# Patient Record
Sex: Female | Born: 1935 | Race: White | Hispanic: No | Marital: Married | Smoking: Former smoker
Health system: Southern US, Community
[De-identification: ages and names within clinical notes are randomized; demographics above are authoritative.]

## PROBLEM LIST (undated history)

## (undated) DIAGNOSIS — J45909 Unspecified asthma, uncomplicated: Secondary | ICD-10-CM

## (undated) DIAGNOSIS — I4891 Unspecified atrial fibrillation: Secondary | ICD-10-CM

## (undated) DIAGNOSIS — C801 Malignant (primary) neoplasm, unspecified: Secondary | ICD-10-CM

## (undated) DIAGNOSIS — I499 Cardiac arrhythmia, unspecified: Secondary | ICD-10-CM

## (undated) DIAGNOSIS — I2510 Atherosclerotic heart disease of native coronary artery without angina pectoris: Secondary | ICD-10-CM

## (undated) DIAGNOSIS — J449 Chronic obstructive pulmonary disease, unspecified: Secondary | ICD-10-CM

## (undated) DIAGNOSIS — F32A Depression, unspecified: Secondary | ICD-10-CM

## (undated) DIAGNOSIS — F329 Major depressive disorder, single episode, unspecified: Secondary | ICD-10-CM

## (undated) DIAGNOSIS — M1990 Unspecified osteoarthritis, unspecified site: Secondary | ICD-10-CM

## (undated) DIAGNOSIS — G4730 Sleep apnea, unspecified: Secondary | ICD-10-CM

## (undated) HISTORY — PX: FRACTURE SURGERY: SHX138

## (undated) HISTORY — PX: CARDIAC CATHETERIZATION: SHX172

## (undated) HISTORY — PX: APPENDECTOMY: SHX54

---

## 2003-07-29 ENCOUNTER — Other Ambulatory Visit: Payer: Self-pay

## 2004-04-07 ENCOUNTER — Ambulatory Visit: Payer: Self-pay | Admitting: Unknown Physician Specialty

## 2004-04-19 ENCOUNTER — Ambulatory Visit: Payer: Self-pay | Admitting: Unknown Physician Specialty

## 2004-05-25 ENCOUNTER — Ambulatory Visit: Payer: Self-pay | Admitting: Gastroenterology

## 2005-01-27 ENCOUNTER — Ambulatory Visit: Payer: Self-pay | Admitting: Ophthalmology

## 2005-02-06 ENCOUNTER — Ambulatory Visit: Payer: Self-pay | Admitting: Ophthalmology

## 2006-09-28 ENCOUNTER — Ambulatory Visit: Payer: Self-pay | Admitting: Unknown Physician Specialty

## 2006-12-27 ENCOUNTER — Ambulatory Visit: Payer: Self-pay | Admitting: Physician Assistant

## 2007-03-25 ENCOUNTER — Ambulatory Visit: Payer: Self-pay | Admitting: Ophthalmology

## 2007-03-25 HISTORY — PX: CATARACT EXTRACTION W/ INTRAOCULAR LENS IMPLANT: SHX1309

## 2007-06-03 ENCOUNTER — Ambulatory Visit: Payer: Self-pay | Admitting: Internal Medicine

## 2007-06-28 ENCOUNTER — Ambulatory Visit: Payer: Self-pay | Admitting: Oncology

## 2007-07-04 ENCOUNTER — Ambulatory Visit: Payer: Self-pay | Admitting: Oncology

## 2007-07-24 ENCOUNTER — Ambulatory Visit: Payer: Self-pay | Admitting: Unknown Physician Specialty

## 2007-07-29 ENCOUNTER — Ambulatory Visit: Payer: Self-pay | Admitting: Oncology

## 2008-02-28 HISTORY — PX: CATARACT EXTRACTION W/ INTRAOCULAR LENS IMPLANT: SHX1309

## 2008-05-28 ENCOUNTER — Ambulatory Visit: Payer: Self-pay | Admitting: Oncology

## 2008-06-03 ENCOUNTER — Ambulatory Visit: Payer: Self-pay | Admitting: Oncology

## 2008-06-18 ENCOUNTER — Ambulatory Visit: Payer: Self-pay | Admitting: Oncology

## 2008-06-27 ENCOUNTER — Ambulatory Visit: Payer: Self-pay | Admitting: Oncology

## 2008-08-26 ENCOUNTER — Ambulatory Visit: Payer: Self-pay | Admitting: Internal Medicine

## 2008-09-24 ENCOUNTER — Ambulatory Visit: Payer: Self-pay | Admitting: Unknown Physician Specialty

## 2009-01-27 ENCOUNTER — Inpatient Hospital Stay: Payer: Self-pay | Admitting: Internal Medicine

## 2009-03-11 ENCOUNTER — Ambulatory Visit: Payer: Self-pay | Admitting: Unknown Physician Specialty

## 2009-03-24 ENCOUNTER — Ambulatory Visit: Payer: Self-pay | Admitting: Unknown Physician Specialty

## 2009-04-01 ENCOUNTER — Emergency Department: Payer: Self-pay | Admitting: Emergency Medicine

## 2009-06-07 ENCOUNTER — Ambulatory Visit: Payer: Self-pay | Admitting: Oncology

## 2009-06-27 ENCOUNTER — Ambulatory Visit: Payer: Self-pay | Admitting: Oncology

## 2009-06-30 ENCOUNTER — Ambulatory Visit: Payer: Self-pay | Admitting: Oncology

## 2009-07-28 ENCOUNTER — Ambulatory Visit: Payer: Self-pay | Admitting: Oncology

## 2010-04-26 ENCOUNTER — Ambulatory Visit: Payer: Self-pay | Admitting: Cardiology

## 2010-06-09 ENCOUNTER — Ambulatory Visit: Payer: Self-pay | Admitting: Oncology

## 2010-06-15 ENCOUNTER — Ambulatory Visit: Payer: Self-pay | Admitting: Oncology

## 2010-06-28 ENCOUNTER — Ambulatory Visit: Payer: Self-pay | Admitting: Oncology

## 2010-10-18 ENCOUNTER — Emergency Department: Payer: Self-pay | Admitting: Emergency Medicine

## 2011-06-12 ENCOUNTER — Ambulatory Visit: Payer: Self-pay | Admitting: Oncology

## 2011-06-16 ENCOUNTER — Ambulatory Visit: Payer: Self-pay | Admitting: Oncology

## 2011-06-28 ENCOUNTER — Ambulatory Visit: Payer: Self-pay | Admitting: Oncology

## 2012-01-09 ENCOUNTER — Observation Stay: Payer: Self-pay | Admitting: Internal Medicine

## 2012-01-09 LAB — COMPREHENSIVE METABOLIC PANEL
Alkaline Phosphatase: 75 U/L (ref 50–136)
Calcium, Total: 8.4 mg/dL — ABNORMAL LOW (ref 8.5–10.1)
Co2: 27 mmol/L (ref 21–32)
EGFR (Non-African Amer.): 46 — ABNORMAL LOW
Glucose: 107 mg/dL — ABNORMAL HIGH (ref 65–99)
Osmolality: 283 (ref 275–301)
SGOT(AST): 30 U/L (ref 15–37)
SGPT (ALT): 18 U/L (ref 12–78)
Sodium: 140 mmol/L (ref 136–145)

## 2012-01-09 LAB — CK TOTAL AND CKMB (NOT AT ARMC)
CK, Total: 78 U/L (ref 21–215)
CK, Total: 77 U/L (ref 21–215)
CK-MB: 1.9 ng/mL (ref 0.5–3.6)
CK-MB: 1.9 ng/mL (ref 0.5–3.6)

## 2012-01-09 LAB — CBC
HGB: 11.9 g/dL — ABNORMAL LOW (ref 12.0–16.0)
MCH: 28.6 pg (ref 26.0–34.0)
MCHC: 33 g/dL (ref 32.0–36.0)
MCV: 87 fL (ref 80–100)
RBC: 4.15 10*6/uL (ref 3.80–5.20)
WBC: 6.4 10*3/uL (ref 3.6–11.0)

## 2012-01-09 LAB — PROTIME-INR: INR: 2.5

## 2012-01-09 LAB — APTT: Activated PTT: 36 secs — ABNORMAL HIGH (ref 23.6–35.9)

## 2012-01-09 LAB — TROPONIN I: Troponin-I: <0.02 ng/mL

## 2012-01-10 LAB — CK TOTAL AND CKMB (NOT AT ARMC)
CK, Total: 70 U/L (ref 21–215)
CK-MB: 1.5 ng/mL (ref 0.5–3.6)

## 2012-01-10 LAB — LIPID PANEL
Cholesterol: 152 mg/dL (ref 0–200)
HDL Cholesterol: 48 mg/dL (ref 40–60)
Ldl Cholesterol, Calc: 68 mg/dL (ref 0–100)
VLDL Cholesterol, Calc: 36 mg/dL (ref 5–40)

## 2012-01-10 LAB — TROPONIN I: Troponin-I: <0.02 ng/mL

## 2012-01-11 LAB — PROTIME-INR
INR: 2.5
Prothrombin Time: 26.9 secs — ABNORMAL HIGH (ref 11.5–14.7)

## 2012-06-12 ENCOUNTER — Ambulatory Visit: Payer: Self-pay | Admitting: Internal Medicine

## 2012-12-03 ENCOUNTER — Ambulatory Visit: Payer: Self-pay | Admitting: Internal Medicine

## 2013-05-18 ENCOUNTER — Inpatient Hospital Stay: Payer: Self-pay | Admitting: Internal Medicine

## 2013-05-18 LAB — PROTIME-INR
INR: 3
Prothrombin Time: 29.9 secs — ABNORMAL HIGH (ref 11.5–14.7)

## 2013-05-18 LAB — CBC
HCT: 34.9 % — AB (ref 35.0–47.0)
HGB: 11.2 g/dL — ABNORMAL LOW (ref 12.0–16.0)
MCH: 27.6 pg (ref 26.0–34.0)
MCHC: 32.2 g/dL (ref 32.0–36.0)
MCV: 86 fL (ref 80–100)
PLATELETS: 189 10*3/uL (ref 150–440)
RBC: 4.08 10*6/uL (ref 3.80–5.20)
RDW: 15.4 % — ABNORMAL HIGH (ref 11.5–14.5)
WBC: 7.4 10*3/uL (ref 3.6–11.0)

## 2013-05-18 LAB — BASIC METABOLIC PANEL
Anion Gap: 5 — ABNORMAL LOW (ref 7–16)
BUN: 16 mg/dL (ref 7–18)
CHLORIDE: 110 mmol/L — AB (ref 98–107)
Calcium, Total: 8.4 mg/dL — ABNORMAL LOW (ref 8.5–10.1)
Co2: 25 mmol/L (ref 21–32)
Creatinine: 1.19 mg/dL (ref 0.60–1.30)
EGFR (African American): 49 — ABNORMAL LOW
EGFR (Non-African Amer.): 42 — ABNORMAL LOW
Glucose: 136 mg/dL — ABNORMAL HIGH (ref 65–99)
OSMOLALITY: 283 (ref 275–301)
Potassium: 3.7 mmol/L (ref 3.5–5.1)
Sodium: 140 mmol/L (ref 136–145)

## 2013-05-18 LAB — URINALYSIS, COMPLETE
BILIRUBIN, UR: NEGATIVE
BLOOD: NEGATIVE
Bacteria: NONE SEEN
Glucose,UR: NEGATIVE mg/dL (ref 0–75)
Ketone: NEGATIVE
LEUKOCYTE ESTERASE: NEGATIVE
NITRITE: NEGATIVE
PROTEIN: NEGATIVE
Ph: 7 (ref 4.5–8.0)
RBC,UR: 1 /HPF (ref 0–5)
Specific Gravity: 1.008 (ref 1.003–1.030)
Squamous Epithelial: 1

## 2013-05-18 LAB — TROPONIN I
TROPONIN-I: 0.03 ng/mL
TROPONIN-I: 0.23 ng/mL — AB
Troponin-I: 0.39 ng/mL — ABNORMAL HIGH

## 2013-05-18 LAB — CK-MB
CK-MB: 2.8 ng/mL (ref 0.5–3.6)
CK-MB: 4 ng/mL — ABNORMAL HIGH (ref 0.5–3.6)
CK-MB: 4.1 ng/mL — AB (ref 0.5–3.6)
CK-MB: 3.9 ng/mL — ABNORMAL HIGH (ref 0.5–3.6)

## 2013-05-18 LAB — PRO B NATRIURETIC PEPTIDE: B-Type Natriuretic Peptide: 651 pg/mL — ABNORMAL HIGH (ref 0–450)

## 2013-05-19 LAB — CBC WITH DIFFERENTIAL/PLATELET
BASOS ABS: 0.1 10*3/uL (ref 0.0–0.1)
BASOS PCT: 1.3 %
Eosinophil #: 0.3 10*3/uL (ref 0.0–0.7)
Eosinophil %: 4 %
HCT: 33 % — AB (ref 35.0–47.0)
HGB: 10.6 g/dL — AB (ref 12.0–16.0)
LYMPHS ABS: 3.4 10*3/uL (ref 1.0–3.6)
Lymphocyte %: 43.9 %
MCH: 27.6 pg (ref 26.0–34.0)
MCHC: 32.1 g/dL (ref 32.0–36.0)
MCV: 86 fL (ref 80–100)
MONO ABS: 0.7 x10 3/mm (ref 0.2–0.9)
Monocyte %: 9.2 %
NEUTROS ABS: 3.2 10*3/uL (ref 1.4–6.5)
NEUTROS PCT: 41.6 %
PLATELETS: 197 10*3/uL (ref 150–440)
RBC: 3.85 10*6/uL (ref 3.80–5.20)
RDW: 14.9 % — ABNORMAL HIGH (ref 11.5–14.5)
WBC: 7.8 10*3/uL (ref 3.6–11.0)

## 2013-05-19 LAB — BASIC METABOLIC PANEL
ANION GAP: 3 — AB (ref 7–16)
BUN: 18 mg/dL (ref 7–18)
CREATININE: 1.09 mg/dL (ref 0.60–1.30)
Calcium, Total: 8.5 mg/dL (ref 8.5–10.1)
Chloride: 110 mmol/L — ABNORMAL HIGH (ref 98–107)
Co2: 27 mmol/L (ref 21–32)
GFR CALC AF AMER: 55 — AB
GFR CALC NON AF AMER: 47 — AB
GLUCOSE: 95 mg/dL (ref 65–99)
Osmolality: 281 (ref 275–301)
POTASSIUM: 4.3 mmol/L (ref 3.5–5.1)
Sodium: 140 mmol/L (ref 136–145)

## 2013-05-19 LAB — PROTIME-INR
INR: 2.9
Prothrombin Time: 29.7 secs — ABNORMAL HIGH (ref 11.5–14.7)

## 2013-05-19 LAB — LIPID PANEL
Cholesterol: 136 mg/dL (ref 0–200)
HDL Cholesterol: 46 mg/dL (ref 40–60)
Ldl Cholesterol, Calc: 58 mg/dL (ref 0–100)
TRIGLYCERIDES: 162 mg/dL (ref 0–200)
VLDL Cholesterol, Calc: 32 mg/dL (ref 5–40)

## 2013-05-19 LAB — MAGNESIUM: Magnesium: 1.7 mg/dL — ABNORMAL LOW

## 2013-05-19 LAB — CK-MB: CK-MB: 3.5 ng/mL (ref 0.5–3.6)

## 2013-05-19 LAB — TSH: Thyroid Stimulating Horm: 3.22 u[IU]/mL

## 2013-06-13 ENCOUNTER — Ambulatory Visit: Payer: Self-pay | Admitting: Internal Medicine

## 2014-05-31 ENCOUNTER — Emergency Department
Admit: 2014-05-31 | Disposition: A | Discharge status: Home / Self Care | Payer: Self-pay | Admitting: Physician Assistant

## 2014-06-15 ENCOUNTER — Ambulatory Visit
Admit: 2014-06-15 | Disposition: A | Discharge status: Home / Self Care | Payer: Self-pay | Attending: Internal Medicine | Admitting: Internal Medicine

## 2014-06-16 NOTE — H&P (Signed)
PATIENT NAME:  Madeline Cunningham, Madeline Cunningham MR#:  [id number] DATE OF BIRTH:  1930-02-14  DATE OF ADMISSION:  01/09/2012  PRIMARY CARE PHYSICIAN: Dr. Frazier Richards ER REFERRING PHYSICIAN:  Dr. Corky Downs   CHIEF COMPLAINT: Chest pain.   HISTORY OF PRESENT ILLNESS: The patient is an 81 year old female with history of hypertension, chronic history of hypertension, deep vein thrombosis and PE, and also history of chronic obstructive pulmonary disease, came in with chest pain that started yesterday afternoon but got worse since this morning, complains of midsternal chest discomfort radiating to the back, relieved with nitroglycerin. Also, the patient also noticed some cough and also pedal edema. She denies any nausea, vomiting, or sweating, and chest pain was relieved with nitroglycerin. There are no aggravating factors. She says that she had around 6 out of 10 in severity of the chest pain.  She did not lose any consciousness, no palpitations. The patient's blood pressure in the Emergency Room was 202/77 and pulse 68. The patient received nitroglycerin sublingual in the Emergency Room. Right now she is chest pain-free.   PAST MEDICAL HISTORY: Significant for: 1. History of pulmonary embolus in 1999 and required thrombolytics, and she has been on Coumadin since then.  2. Hypertension.  3. Gastroesophageal reflux disease.  4. Depression.  5. History of angina, had a cardiac catheterization in 2004 and also 2012.  6. History of depression and anxiety.  7. History of breast cancer, status post radiation.  8. Osteoarthritis.  9. Osteoporosis.  10. The patient had a cardiac catheterization also in February last year which showed one vessel coronary artery disease with chronically occluded RCA and moderate collaterals from LAD. The patient had stenosis of the left main and 100% stenosis of the RCA.   MEDICATIONS:  1. Advair as needed.  2. Albuterol as needed.  3. Aspirin 81 mg daily.  4. Calcium 2400 mg once a  week. 5. Coumadin 5 mg daily.  6. Neurontin 300 mg p.o. t.i.d.  7. Ativan 0.5 mg as needed. 8. Methadone 5 mg p.o. b.i.d. as needed. 9. Metoprolol succinate 200 mg extended release daily.  10. Requip 5 mg p.o. daily.  11. Vitamin D 50,000 units once a week.   SOCIAL HISTORY: The patient quit smoking a long time back. No alcohol. No drugs. Lives with her husband.   PAST SURGICAL HISTORY: Significant for history of breast lumpectomy.   ALLERGIES: Evista caused the PE, Phenergan caused anaphylaxis, Prempro caused breast soreness. Zoloft causes gastrointestinal distress.   FAMILY HISTORY: Positive for coronary artery disease and hypertension.   REVIEW OF SYSTEMS:  CONSTITUTIONAL: Denies fever or fatigue. EYES: No blurred vision. ENT: No tinnitus. No ear pain. No difficulty swallowing. No epistaxis. RESPIRATORY:  The patient has cough. No wheezing. The patient has a history of chronic obstructive pulmonary disease and also uses oxygen at night. CARDIOVASCULAR: Chest pain since earlier this morning. No palpitations. No syncope. GASTROINTESTINAL: No nausea. No vomiting. No abdominal pain. GENITOURINARY: No dysuria. ENDOCRINE: No polyuria, no polydipsia. INTEGUMENTARY: No skin rashes. MUSCULOSKELETAL: No joint pain. NEUROLOGIC: No numbness or weakness. PSYCH: No anxiety or insomnia.   PHYSICAL EXAMINATION:  VITAL SIGNS: Temperature 98 degrees Fahrenheit, pulse 68, respirations 20, blood pressure 202/77, saturations 96% on room air. During my visit the blood pressure was in 173/69, pulse 62.   GENERAL: She is alert, awake, oriented, answering questions appropriately.   HEENT: Head: Atraumatic, normocephalic. Pupils are equally reacting to light. Extraocular movements intact. ENT: No tympanic membrane congestion. No turbinate hypertrophy. No  oropharyngeal erythema.  NECK: Normal range of motion. No JVD. No carotid bruit. No lymphadenopathy.   CARDIOVASCULAR: S1, S2 regular. No murmurs. PMI is not  displaced. Pulses are intact bilaterally for pedal pulses and dorsalis pedis.   LUNGS: Clear to auscultation. No wheeze. No rales. Not using accessory muscles for respiration.  ABDOMEN: Soft, nontender, nondistended. Bowel sounds are present. No hernias. No CVA tenderness.   EXTREMITIES: The patient has trace edema. No cyanosis. No clubbing.   NEUROLOGIC: Alert, awake, oriented. Cranial nerves II through XII are intact. Power five out of five in upper and lower extremities. Deep tendon reflexes 2+ bilaterally. No motor and sensory deficit.   PSYCHIATRIC: Mood and affect are within normal limits.   LABORATORY, DIAGNOSTIC AND RADIOLOGICAL DATA: Troponin less than 0.02, CK total 78, CPK-MB 1.9, INR 2.5. WBC 6.4, hemoglobin 11.9, hematocrit 36, platelets 203. Electrolytes: Sodium 140, potassium 4.4, chloride 109, bicarbonate 27, BUN 21, creatinine 1.1, glucose 107. Electrolytes within normal limits. The patient's EKG shows normal sinus rhythm with 68 beats per minute.   ASSESSMENT AND PLAN: The patient is an 81 year old female with history of coronary artery disease by cardiac catheterization one year ago, came in with:  1. Chest pain relieved with nitroglycerin: Observe her overnight.  Cycle the troponins, CK and continue her aspirin, beta blockers, and also get fasting lipase. She is already on Coumadin. INR is therapeutic. Continue that. Blood pressure is elevated with accelerated hypertension on arrival.  She is on Toprol XR 100 mg daily. Continue that and add ACE inhibitor. The patient will be monitored on telemetry. If she has worse troponins, can get Cardiology consult with Dr. Saralyn Pilar. Otherwise, she can have an outpatient stress test.  2. History of PE and deep vein thrombosis:  She is on Coumadin, continue that.  3. History of restless leg syndrome: Continue Requip.  4. History of depression: Continue home medication.   The patient's condition is stable. She will be seen by Dr. Ouida Sills  tomorrow. TIME SPENT ON HISTORY AND PHYSICAL: About 55 minutes.   ____________________________ Epifanio Lesches, MD sk:cbb D: 01/09/2012 20:31:45 ET T: 01/10/2012 06:52:16 ET JOB#: [id number]  cc: Epifanio Lesches, MD, <Dictator> Ocie Cornfield. Ouida Sills, MD Epifanio Lesches MD ELECTRONICALLY SIGNED 02/13/2012 14:51

## 2014-06-20 NOTE — Consult Note (Signed)
PATIENT NAME:  Madeline Cunningham, TREESE MR#:  [id number] DATE OF BIRTH:  1929/04/04  DATE OF CONSULTATION:  05/18/2013  REFERRING PHYSICIAN:   CONSULTING PHYSICIAN:  Nicholes Mango, MD  REASON FOR CONSULTATION: Sleep apnea, hypertension, hyperlipidemia, pulmonary embolism, coronary artery disease, chest pain with atrial fibrillation with rapid ventricular rate.   CHIEF COMPLAINT: "I had chest pain."   HISTORY OF PRESENT ILLNESS: This is an 81 year old female with known coronary artery disease with apparent previous coronary stenosis and collateralization without evidence of myocardial infarction in the past with sleep apnea, hypertension, hyperlipidemia, pulmonary embolism and COPD on oxygen at night. She has been on Coumadin for her atrial fibrillation history, but now has had atrial fibrillation with rapid ventricular rate, not helped by Toprol-XL at 200 mg with this chest pain. The patient did have relief with nitroglycerin and other medication management, and her heart rate now is more controlled on diltiazem drip. EKG shows atrial fibrillation, rapid ventricular rate and nonspecific ST changes with an elevated troponin of 0.23. The patient has an INR of 3.   REVIEW OF SYSTEMS: The remainder review of systems negative for vision change, ringing in the ears, hearing loss, cough, congestion, heartburn, nausea, vomiting, diarrhea, bloody stools, stomach pain, extremity pain, leg weakness, cramping of the buttocks, known blood clots, headaches, blackouts, dizzy spells, nosebleeds, congestion, trouble swallowing, frequent urination, urination at night, muscle weakness, numbness, anxiety, depression, skin lesions or skin rashes.   PAST MEDICAL HISTORY: 1. Sleep apnea.  2. Hypertension.  3. Hyperlipidemia.  4. Coronary artery disease.  5. COPD.  6. Pulmonary embolism.   FAMILY HISTORY: No family members with early onset of cardiovascular disease or hypertension.   SOCIAL HISTORY: Currently denies alcohol or  tobacco use.   ALLERGIES: As listed.   MEDICATIONS: As listed.   PHYSICAL EXAMINATION:  VITAL SIGNS: Blood pressure is 122/68, heart rate 72 upright, reclining, and irregular.   GENERAL: She is a well appearing female in no acute distress.   HEENT: No icterus, thyromegaly, ulcers or hemorrhage, or xanthelasma.   CARDIOVASCULAR: Irregularly irregular. Normal S1, S2. Distant heart sounds. No apparent murmur, gallop, or rub. PMI is diffuse. Carotid upstroke normal without bruit. Jugular venous pressure is normal.   LUNGS: Bibasilar crackles with a few expiratory wheezes.   ABDOMEN: Soft, nontender, without hepatosplenomegaly or masses. Abdominal aorta is normal size without bruit.   EXTREMITIES: 2+ radial, femoral, dorsalis pedal pulses, with trace lower extremity edema. No cyanosis, clubbing or ulcers.   NEUROLOGIC: She is oriented to time, place, and person, with normal mood and affect.   ASSESSMENT: An 81 year old female with hypertension, hyperlipidemia, coronary artery disease, pulmonary embolism and sleep apnea with acute onset of atrial fibrillation with rapid ventricular rate and elevated troponin of 0.22 with chest pain consistent with possible subendocardial myocardial infarction.   RECOMMENDATIONS: 1. Continue serial ECG and enzymes to assess for extent of myocardial infarction and/or ischemia.  2. Add Cardizem drip for heart rate control and possible change to oral medication for a heart rate between 60 and 90 beats per minute.  3. Anticoagulation for further risk reduction and possible stroke with atrial fibrillation as well as reduction of pulmonary embolism.  4. Echocardiogram for LV systolic dysfunction and valvular heart disease.  5. Further consideration of evaluation including cardiac catheterization if the patient has worsening troponin and/or true subendocardial myocardial infarction.      ____________________________ Corey Skains, MD bjk:lm D: 05/18/2013  18:11:55 ET T: 05/19/2013 00:46:14 ET JOB#: [id number]  cc: Corey Skains, MD, <Dictator> Corey Skains MD ELECTRONICALLY SIGNED 05/21/2013 12:31

## 2014-06-20 NOTE — Discharge Summary (Signed)
PATIENT NAME:  Madeline Cunningham, EKSTRAND MR#:  [id number] DATE OF BIRTH:  19-May-1929  DATE OF ADMISSION:  05/18/2013 DATE OF DISCHARGE:  05/20/2013  DISCHARGE DIAGNOSES: 1. Rapid atrial fibrillation.  2. Non-ST elevated myocardial infarction caused from rapid rate, not thought to be typical plaque rupture.  3. History of pulmonary emboli, on Coumadin.   DISCHARGE MEDICATIONS: Per [facility] med reconciliation system. Please see for details.   HISTORY AND PHYSICAL: Please see detailed history and physical done on admission.   HOSPITAL COURSE: The patient was admitted in extremely rapid atrial fibrillation. She was put on Diltiazem drip, the rate was controlled. She was converted to oral diltiazem 120 with a heart rate in the 90s. She was ambulating well with that, had no further complications. She will be on the metoprolol at home as well, which she had been in the past but I believe this is being restarted. She will stop her amlodipine. Nothing else will change.  She will stay on her usual warfarin dose, etc. This will all be followed closely. She will call with any issues prior to that. Cholesterol was good with LDL 58 on her simvastatin. TSH was normal as was glucose. She had a mild decrease in her renal function. Echocardiogram was done that showed 45 to 50 LV function without any other significant findings. Troponin and CK-MB were minimally elevated as noted. Chest x-ray showed no acute abnormalities.   TIME SPENT: It took approximately 34 minutes to do discharge tasks.   ____________________________ Ocie Cornfield. Ouida Sills, MD mwa:sg D: 05/20/2013 07:57:50 ET T: 05/20/2013 09:39:35 ET JOB#: [id number]  cc: Ocie Cornfield. Ouida Sills, MD, <Dictator> Kirk Ruths MD ELECTRONICALLY SIGNED 05/22/2013 8:03

## 2014-06-20 NOTE — H&P (Signed)
PATIENT NAME:  Madeline Cunningham, Madeline Cunningham MR#:  [id number] DATE OF BIRTH:  07/03/1929  DATE OF ADMISSION:  05/18/2013  ADDENDUM:  MEDICATIONS:  1.  Methadone 5 mg at bedtime.  2.  Coumadin 5 mg daily.  3.  Gabapentin 300 mg t.i.d.  4.  Simvastatin 40 mg daily.  5.  Requip 5 mg daily.  6.  Metoprolol 200 mg daily.  7.  Ventolin HFA 2 puffs q.6 hours p.r.n.  8.  Norvasc 5 mg daily.  9.  MiraLax daily.  10.  Calcium and vitamin D 4 times a week.  11.  Omeprazole 40 mg daily.     ____________________________ Donell Beers. Benjie Karvonen, MD spm:cs D: 05/18/2013 11:49:29 ET T: 05/18/2013 14:19:55 ET JOB#: [id number]  cc: Marcellius Montagna P. Benjie Karvonen, MD, <Dictator> Donell Beers Domonique Brouillard MD ELECTRONICALLY SIGNED 05/18/2013 14:43

## 2014-06-20 NOTE — H&P (Signed)
PATIENT NAME:  Madeline Cunningham, Madeline Cunningham MR#:  [id number] DATE OF BIRTH:  10-02-1929  DATE OF ADMISSION:  05/18/2013  PRIMARY CARE PHYSICIAN: Dr. Ouida Sills.   CHIEF COMPLAINT: "My heart was acting up."   HISTORY OF PRESENT ILLNESS: This is a very pleasant 81 year old female with a history of asthma on 3 L oxygen at home and chronic respiratory failure with sleep apnea on CPAP machine, CAD, history of deep vein thrombosis and PE, on chronic Coumadin, hypertension, and breast cancer presents with the above complaint. The patient woke up this morning and started having this kind of weird sensation in her throat, went to her chest and then to her left arm. She took four nitroglycerin at home without any relief. She had pain and palpitations. She had no shortness of breath, wheezing, nausea or any other symptoms associated with it. She called EMS. Upon arrival she was noted to be in atrial fibrillation/rapid ventricular response. She was given a diltiazem bolus and now on diltiazem drip.   REVIEW OF SYSTEMS:  CONSTITUTIONAL:  No fever. Positive fatigue and weakness for the past two days. No weight loss or gain. EYES: No blurred or double vision. Positive cataracts.  ENT: No ear pain, epistaxis. RESPIRATORY: No cough, wheezing, hemoptysis. Positive COPD/asthma.  CARDIOVASCULAR: Positive chest pain. Positive palpitations. No orthopnea, arrhythmia, dyspnea on exertion.  GASTROINTESTINAL: No nausea and vomiting, diarrhea, abdominal pain  GENITOURINARY: No dysuria or hematuria.  ENDOCRINE: No polyuria or polydipsia.  HEMATOLOGIC AND LYMPHATIC:  Positive, anemia, positive easy bruising.  SKIN: No rash or lesions.  MUSCULOSKELETAL: Positive arthritis.  NEUROLOGIC: No history of ataxia, dementia, dysarthria.  PSYCHIATRIC: No history of anxiety or depression.   PAST MEDICAL HISTORY: 1. PE/deep venous thrombosis on chronic Coumadin.  2. Arthritis.  3. Restless leg syndrome.  4. History of breast cancer.  5.  History of hypertension.  6. History of sleep apnea.  7. History of asthma with chronic respiratory failure on 3 liters of oxygen.   The patient had a cardiac catheterization in 2012 which showed a chronically occluded RCA, moderate collaterals to the LAD and stenosis of the left main and 100% stenosis of the RCA,   PAST SURGICAL HISTORY: 1. Lumpectomy.  2. Appendectomy.  ALLERGIES:  1. PHENERGAN.  2. EVISTA.  3. PREMPRO.   4. ZOLOFT.    SOCIAL HISTORY: No tobacco, alcohol or drug use. She was a former smoker.   FAMILY HISTORY: Positive for coronary artery disease.   MEDICATIONS: 1. Vitamin D  2. Requip 5 mg daily.  3. Metoprolol 200 mg daily.  4. Methadone 5 mg at night  5. Ativan 0.5 mg as needed.  6. Gabapentin 300 mg t.i.d.  7. Coumadin 5 mg daily.  8. Advair Diskus 250/50 b.i.d.  9. Aspirin 81 mg daily.  10. Calcium weekly.  11. Albuterol as needed.  12. Norvasc 5 mg daily  PHYSICAL EXAMINATION: VITAL SIGNS: Temperature 98.2, pulse 122, respirations 20, blood pressure 118/69, 96% on 2.5 liters.  GENERAL: The patient is alert, oriented, in acute distress.  HEENT: Head is atraumatic. Pupils are round and reactive. Sclerae anicteric. Mucous membranes are moist.  OROPHARYNX: Clear.  NECK: Supple without JVD, carotid bruit, or enlarged thyroid.  CARDIOVASCULAR: Irregularly irregular tachycardia without murmur, gallops, rubs.  PMI is hard to palpate due to body habitus.  LUNGS: Clear to auscultation without crackles, rales, rhonchi, or wheezing. No respiratory distress. No egophony and normal chest expansion.  ABDOMEN: Bowel sounds are positive. Nontender, nondistended. No hepatosplenomegaly.  SKIN:  Without rash or lesions.  EXTREMITIES: No clubbing, cyanosis. She has 1+ pitting edema bilaterally and symmetrically.  NEUROLOGIC: Cranial nerves II through XII are grossly intact. There are no focal deficits.  BACK: No CVA or vertebral tenderness.   LABORATORY, DIAGNOSTIC  AND RADIOLOGICAL DATA: Sodium 140, potassium 3.7, chloride 110, bicarbonate 25, BUN 16, creatinine 1.19,  glucose 136. BNP 651, white blood cells 7.4, hemoglobin 11.2, hematocrit 35, platelets are 189. Troponin 0.03. INR is 3.0. Chest x-ray shows no acute cardiopulmonary disease.   EKG is atrial fibrillation. No ST elevations or depressions.   ASSESSMENT AND PLAN: A 81 year old female with a history of chronic respiratory failure on 3 liters of oxygen, sleep apnea, coronary artery disease, remote history of deep venous thrombosis, pulmonary embolism on chronic Coumadin who presents with chest pain and new onset atrial fibrillation.  1. New-onset atrial fibrillation. The patient is currently on a diltiazem drip, which we will continue. The goal heart rate being less than 100. We will need to transition her to p.o. medications. Dr. Saralyn Pilar has been consulted. We will order an echocardiogram, TSH. Further evaluation per cardiology her CHADS score is 2 for age as well as hypertension. She is already on Coumadin, which we will continue. Her INR is 3.0.  2. Chest pain likely associated with palpitations, but given her history of coronary artery disease, we will continue to monitor her troponin. First set is negative. We will obtain two more. The patient will be admitted with telemetry.  3. History of deep vein thrombosis and pulmonary embolism. The patient is currently on Coumadin which we will continue.  4. History of asthma with chronic respiratory failure. The patient is on 3 liters nasal cannula, which we will continue. She does not seem to be in exacerbation at this time.  5. History of sleep apnea. We will provide CPAP machine.  6. Hypertension. We will follow closely as the patient is now on a diltiazem drip and we will continue her outpatient medications.   THE PATIENT IS DO NOT RESUSCITATE STATUS.   TIME SPENT: Approximately 50 minutes.    ____________________________ Donell Beers. Benjie Karvonen,  MD spm:sg D: 05/18/2013 11:00:52 ET T: 05/18/2013 13:09:15 ET JOB#: [id number]  cc: Shavell Nored P. Benjie Karvonen, MD, <Dictator> Ocie Cornfield. Ouida Sills, MD  Donell Beers Bazil Dhanani MD ELECTRONICALLY SIGNED 05/18/2013 14:43

## 2014-07-14 ENCOUNTER — Ambulatory Visit
Admission: RE | Admit: 2014-07-14 | Discharge: 2014-07-14 | Disposition: A | Discharge status: Home / Self Care | Payer: Medicare Other | Source: Ambulatory Visit | Attending: Specialist | Admitting: Specialist

## 2014-07-14 ENCOUNTER — Other Ambulatory Visit: Payer: Self-pay | Admitting: Specialist

## 2014-07-14 DIAGNOSIS — M79604 Pain in right leg: Secondary | ICD-10-CM

## 2014-07-14 DIAGNOSIS — M79606 Pain in leg, unspecified: Secondary | ICD-10-CM | POA: Diagnosis present

## 2014-07-14 DIAGNOSIS — Z7901 Long term (current) use of anticoagulants: Secondary | ICD-10-CM | POA: Insufficient documentation

## 2014-07-14 DIAGNOSIS — R0602 Shortness of breath: Secondary | ICD-10-CM

## 2014-07-14 DIAGNOSIS — I4891 Unspecified atrial fibrillation: Secondary | ICD-10-CM | POA: Diagnosis not present

## 2015-04-20 ENCOUNTER — Encounter
Admission: RE | Admit: 2015-04-20 | Discharge: 2015-04-20 | Disposition: A | Discharge status: Home / Self Care | Payer: Medicare Other | Source: Ambulatory Visit | Attending: Orthopedic Surgery | Admitting: Orthopedic Surgery

## 2015-04-20 DIAGNOSIS — I4891 Unspecified atrial fibrillation: Secondary | ICD-10-CM

## 2015-04-20 DIAGNOSIS — Z01810 Encounter for preprocedural cardiovascular examination: Secondary | ICD-10-CM | POA: Insufficient documentation

## 2015-04-20 DIAGNOSIS — Z01812 Encounter for preprocedural laboratory examination: Secondary | ICD-10-CM | POA: Insufficient documentation

## 2015-04-20 DIAGNOSIS — I2510 Atherosclerotic heart disease of native coronary artery without angina pectoris: Secondary | ICD-10-CM

## 2015-04-20 DIAGNOSIS — J449 Chronic obstructive pulmonary disease, unspecified: Secondary | ICD-10-CM | POA: Diagnosis not present

## 2015-04-20 HISTORY — DX: Chronic obstructive pulmonary disease, unspecified: J44.9

## 2015-04-20 HISTORY — DX: Sleep apnea, unspecified: G47.30

## 2015-04-20 HISTORY — DX: Atherosclerotic heart disease of native coronary artery without angina pectoris: I25.10

## 2015-04-20 HISTORY — DX: Unspecified asthma, uncomplicated: J45.909

## 2015-04-20 HISTORY — DX: Depression, unspecified: F32.A

## 2015-04-20 HISTORY — DX: Cardiac arrhythmia, unspecified: I49.9

## 2015-04-20 HISTORY — DX: Major depressive disorder, single episode, unspecified: F32.9

## 2015-04-20 HISTORY — DX: Unspecified osteoarthritis, unspecified site: M19.90

## 2015-04-20 LAB — BASIC METABOLIC PANEL
ANION GAP: 5 (ref 5–15)
BUN: 27 mg/dL — ABNORMAL HIGH (ref 6–20)
CO2: 27 mmol/L (ref 22–32)
Calcium: 9.1 mg/dL (ref 8.9–10.3)
Chloride: 105 mmol/L (ref 101–111)
Creatinine, Ser: 1.33 mg/dL — ABNORMAL HIGH (ref 0.44–1.00)
GFR calc Af Amer: 42 mL/min — ABNORMAL LOW (ref >60)
GFR calc non Af Amer: 36 mL/min — ABNORMAL LOW (ref >60)
GLUCOSE: 94 mg/dL (ref 65–99)
Potassium: 4.3 mmol/L (ref 3.5–5.1)
Sodium: 137 mmol/L (ref 135–145)

## 2015-04-20 LAB — CBC
HCT: 37.4 % (ref 35.0–47.0)
Hemoglobin: 12.5 g/dL (ref 12.0–16.0)
MCH: 30.9 pg (ref 26.0–34.0)
MCHC: 33.4 g/dL (ref 32.0–36.0)
MCV: 92.3 fL (ref 80.0–100.0)
PLATELETS: 220 10*3/uL (ref 150–440)
RBC: 4.05 MIL/uL (ref 3.80–5.20)
RDW: 13.9 % (ref 11.5–14.5)
WBC: 7.5 10*3/uL (ref 3.6–11.0)

## 2015-04-20 LAB — DIFFERENTIAL
BASOS ABS: 0.1 10*3/uL (ref 0–0.1)
Basophils Relative: 1 %
Eosinophils Absolute: 0.3 10*3/uL (ref 0–0.7)
Eosinophils Relative: 5 %
Lymphocytes Relative: 30 %
Lymphs Abs: 2.2 10*3/uL (ref 1.0–3.6)
MONO ABS: 1 10*3/uL — AB (ref 0.2–0.9)
Monocytes Relative: 13 %
NEUTROS ABS: 3.9 10*3/uL (ref 1.4–6.5)
Neutrophils Relative %: 51 %

## 2015-04-20 NOTE — Pre-Procedure Instructions (Addendum)
Madeline Cunningham is a 82 y.o. female here for a follow-up visit to discuss the results of her EMG nerve conduction test. We did have a discussion with her primary care physician, Dr. Ouida Sills. He feels that she should not stop her blood thinner, but she is allowed to have surgery. Her nerve test did reveal that she has significant carpal tunnel syndrome. Today she states that the pain in her hands is less than it has been. It is not waking her up at night.   Above note taken from Dr. Rudene Christians on 04/12/15.

## 2015-04-20 NOTE — Patient Instructions (Signed)
  Your procedure is scheduled on: Tuesday Feb. 28, 2017. Report to Same Day Surgery. To find out your arrival time please call [phone] between 1PM - 3PM on Monday Feb. 27, 2017.  Remember: Instructions that are not followed completely may result in serious medical risk, up to and including death, or upon the discretion of your surgeon and anesthesiologist your surgery may need to be rescheduled.    _x___ 1. Do not eat food or drink liquids after midnight. No gum chewing or hard candies.     ____ 2. No Alcohol for 24 hours before or after surgery.   ____ 3. Bring all medications with you on the day of surgery if instructed.    __x__ 4. Notify your doctor if there is any change in your medical condition     (cold, fever, infections).     Do not wear jewelry, make-up, hairpins, clips or nail polish.  Do not wear lotions, powders, or perfumes. You may wear deodorant.  Do not shave 48 hours prior to surgery. Men may shave face and neck.  Do not bring valuables to the hospital.    [facility] is not responsible for any belongings or valuables.               Contacts, dentures or bridgework may not be worn into surgery.  Leave your suitcase in the car. After surgery it may be brought to your room.  For patients admitted to the hospital, discharge time is determined by your treatment team.   Patients discharged the day of surgery will not be allowed to drive home.    Please read over the following fact sheets that you were given:   [facility] Preparing for Surgery  _x___ Take these medicines the morning of surgery with A SIP OF WATER:    1. FLUoxetine (PROZAC)   2. gabapentin (NEURONTIN)  3. diltiazem (TIAZAC)  ____ Fleet Enema (as directed)   __x__ Use CHG Soap as directed  ____ Use inhalers on the day of surgery  ____ Stop metformin 2 days prior to surgery    ____ Take 1/2 of usual insulin dose the night before surgery and none on the morning of surgery.   _x___ Do not  stop Coumadin per Dr. Ouida Sills.  _x___ Stop Anti-inflammatories on denies.   Ok to take Tylenol for pain.   ____ Stop supplements until after surgery.    ____ Bring C-Pap to the hospital.

## 2015-04-27 ENCOUNTER — Ambulatory Visit: Payer: Medicare Other | Admitting: Certified Registered"

## 2015-04-27 ENCOUNTER — Encounter: Payer: Self-pay | Admitting: *Deleted

## 2015-04-27 ENCOUNTER — Encounter
Admission: RE | Disposition: A | Discharge status: Home / Self Care | Payer: Self-pay | Source: Ambulatory Visit | Attending: Orthopedic Surgery

## 2015-04-27 ENCOUNTER — Ambulatory Visit
Admission: RE | Admit: 2015-04-27 | Discharge: 2015-04-27 | Disposition: A | Discharge status: Home / Self Care | Payer: Medicare Other | Source: Ambulatory Visit | Attending: Orthopedic Surgery | Admitting: Orthopedic Surgery

## 2015-04-27 DIAGNOSIS — G2581 Restless legs syndrome: Secondary | ICD-10-CM | POA: Diagnosis not present

## 2015-04-27 DIAGNOSIS — G4710 Hypersomnia, unspecified: Secondary | ICD-10-CM | POA: Insufficient documentation

## 2015-04-27 DIAGNOSIS — Z8673 Personal history of transient ischemic attack (TIA), and cerebral infarction without residual deficits: Secondary | ICD-10-CM | POA: Insufficient documentation

## 2015-04-27 DIAGNOSIS — E7800 Pure hypercholesterolemia, unspecified: Secondary | ICD-10-CM | POA: Insufficient documentation

## 2015-04-27 DIAGNOSIS — M810 Age-related osteoporosis without current pathological fracture: Secondary | ICD-10-CM | POA: Diagnosis not present

## 2015-04-27 DIAGNOSIS — G4730 Sleep apnea, unspecified: Secondary | ICD-10-CM | POA: Insufficient documentation

## 2015-04-27 DIAGNOSIS — I10 Essential (primary) hypertension: Secondary | ICD-10-CM | POA: Insufficient documentation

## 2015-04-27 DIAGNOSIS — Z7901 Long term (current) use of anticoagulants: Secondary | ICD-10-CM | POA: Insufficient documentation

## 2015-04-27 DIAGNOSIS — I2510 Atherosclerotic heart disease of native coronary artery without angina pectoris: Secondary | ICD-10-CM | POA: Insufficient documentation

## 2015-04-27 DIAGNOSIS — Z79899 Other long term (current) drug therapy: Secondary | ICD-10-CM | POA: Insufficient documentation

## 2015-04-27 DIAGNOSIS — M1990 Unspecified osteoarthritis, unspecified site: Secondary | ICD-10-CM | POA: Diagnosis not present

## 2015-04-27 DIAGNOSIS — F329 Major depressive disorder, single episode, unspecified: Secondary | ICD-10-CM | POA: Insufficient documentation

## 2015-04-27 DIAGNOSIS — G5601 Carpal tunnel syndrome, right upper limb: Secondary | ICD-10-CM | POA: Insufficient documentation

## 2015-04-27 DIAGNOSIS — K219 Gastro-esophageal reflux disease without esophagitis: Secondary | ICD-10-CM | POA: Insufficient documentation

## 2015-04-27 HISTORY — DX: Unspecified atrial fibrillation: I48.91

## 2015-04-27 HISTORY — PX: CARPAL TUNNEL RELEASE: SHX101

## 2015-04-27 SURGERY — CARPAL TUNNEL RELEASE
Anesthesia: General | Site: Wrist | Laterality: Right | Wound class: Clean

## 2015-04-27 MED ORDER — METOCLOPRAMIDE HCL 10 MG PO TABS: 5.0000 mg | ORAL_TABLET | Freq: Three times a day (TID) | ORAL | Status: DC | PRN

## 2015-04-27 MED ORDER — HYDROCODONE-ACETAMINOPHEN 5-325 MG PO TABS: 1.0000 | ORAL_TABLET | Freq: Four times a day (QID) | ORAL | Status: DC | PRN

## 2015-04-27 MED ORDER — FENTANYL CITRATE (PF) 100 MCG/2ML IJ SOLN: 25.0000 ug | INTRAMUSCULAR | Status: DC | PRN

## 2015-04-27 MED ORDER — LACTATED RINGERS IV SOLN
INTRAVENOUS | Status: DC
  Administered 2015-04-27: 09:00:00 via INTRAVENOUS

## 2015-04-27 MED ORDER — METOCLOPRAMIDE HCL 5 MG/ML IJ SOLN: 5.0000 mg | Freq: Three times a day (TID) | INTRAMUSCULAR | Status: DC | PRN

## 2015-04-27 MED ORDER — ONDANSETRON HCL 4 MG/2ML IJ SOLN: 4.0000 mg | Freq: Once | INTRAMUSCULAR | Status: DC | PRN

## 2015-04-27 MED ORDER — ONDANSETRON HCL 4 MG/2ML IJ SOLN: 4.0000 mg | Freq: Four times a day (QID) | INTRAMUSCULAR | Status: DC | PRN

## 2015-04-27 MED ORDER — FAMOTIDINE 20 MG PO TABS
ORAL_TABLET | ORAL | Status: AC
  Administered 2015-04-27: 20 mg via ORAL
  Filled 2015-04-27: qty 1

## 2015-04-27 MED ORDER — LIDOCAINE HCL (PF) 2 % IJ SOLN
INTRAMUSCULAR | Status: DC | PRN
  Administered 2015-04-27: 50 mg

## 2015-04-27 MED ORDER — PROPOFOL 10 MG/ML IV BOLUS
INTRAVENOUS | Status: DC | PRN
  Administered 2015-04-27: 120 mg via INTRAVENOUS
  Administered 2015-04-27: 30 mg via INTRAVENOUS

## 2015-04-27 MED ORDER — FENTANYL CITRATE (PF) 100 MCG/2ML IJ SOLN
INTRAMUSCULAR | Status: DC | PRN
Start: 2015-04-27 — End: 2015-04-27
  Administered 2015-04-27: 50 ug via INTRAVENOUS
  Administered 2015-04-27 (×2): 25 ug via INTRAVENOUS

## 2015-04-27 MED ORDER — DEXAMETHASONE SODIUM PHOSPHATE 10 MG/ML IJ SOLN
INTRAMUSCULAR | Status: DC | PRN
  Administered 2015-04-27: 5 mg via INTRAVENOUS

## 2015-04-27 MED ORDER — ONDANSETRON HCL 4 MG PO TABS: 4.0000 mg | ORAL_TABLET | Freq: Four times a day (QID) | ORAL | Status: DC | PRN

## 2015-04-27 MED ORDER — BUPIVACAINE HCL (PF) 0.5 % IJ SOLN
INTRAMUSCULAR | Status: AC
  Filled 2015-04-27: qty 30

## 2015-04-27 MED ORDER — HYDROCODONE-ACETAMINOPHEN 5-325 MG PO TABS
1.0000 | ORAL_TABLET | ORAL | Status: DC | PRN
Start: 2015-04-27 — End: 2015-04-27

## 2015-04-27 MED ORDER — GLYCOPYRROLATE 0.2 MG/ML IJ SOLN
INTRAMUSCULAR | Status: DC | PRN
  Administered 2015-04-27: 0.2 mg via INTRAVENOUS

## 2015-04-27 MED ORDER — FAMOTIDINE 20 MG PO TABS
20.0000 mg | ORAL_TABLET | Freq: Once | ORAL | Status: AC
  Administered 2015-04-27: 20 mg via ORAL

## 2015-04-27 MED ORDER — SODIUM CHLORIDE 0.9 % IV SOLN: INTRAVENOUS | Status: DC

## 2015-04-27 MED ORDER — EPHEDRINE SULFATE 50 MG/ML IJ SOLN
INTRAMUSCULAR | Status: DC | PRN
  Administered 2015-04-27 (×2): 10 mg via INTRAVENOUS

## 2015-04-27 MED ORDER — BUPIVACAINE HCL 0.5 % IJ SOLN
INTRAMUSCULAR | Status: DC | PRN
  Administered 2015-04-27: 10 mL

## 2015-04-27 MED ORDER — ONDANSETRON HCL 4 MG/2ML IJ SOLN
INTRAMUSCULAR | Status: DC | PRN
  Administered 2015-04-27: 4 mg via INTRAVENOUS

## 2015-04-27 SURGICAL SUPPLY — 25 items
BANDAGE ACE 3X5.8 VEL STRL LF (GAUZE/BANDAGES/DRESSINGS) ×3 IMPLANT
BNDG ESMARK 4X12 TAN STRL LF (GAUZE/BANDAGES/DRESSINGS) ×3 IMPLANT
CANISTER SUCT 1200ML W/VALVE (MISCELLANEOUS) ×3 IMPLANT
CHLORAPREP W/TINT 26ML (MISCELLANEOUS) ×3 IMPLANT
ELECT CAUTERY NEEDLE 2.0 MIC (NEEDLE) IMPLANT
ELECT CAUTERY NEEDLE TIP 1.0 (MISCELLANEOUS)
ELECTRODE CAUTERY NEDL TIP 1.0 (MISCELLANEOUS) IMPLANT
GAUZE PETRO XEROFOAM 1X8 (MISCELLANEOUS) ×3 IMPLANT
GAUZE SPONGE 4X4 12PLY STRL (GAUZE/BANDAGES/DRESSINGS) ×3 IMPLANT
GLOVE BIOGEL PI IND STRL 9 (GLOVE) ×1 IMPLANT
GLOVE BIOGEL PI INDICATOR 9 (GLOVE) ×2
GLOVE SURG ORTHO 9.0 STRL STRW (GLOVE) ×3 IMPLANT
GOWN SPECIALTY ULTRA XL (MISCELLANEOUS) ×3 IMPLANT
GOWN STRL REUS W/ TWL LRG LVL3 (GOWN DISPOSABLE) ×1 IMPLANT
GOWN STRL REUS W/TWL 2XL LVL3 (GOWN DISPOSABLE) ×3 IMPLANT
GOWN STRL REUS W/TWL LRG LVL3 (GOWN DISPOSABLE) ×2
KIT RM TURNOVER STRD PROC AR (KITS) ×3 IMPLANT
NS IRRIG 500ML POUR BTL (IV SOLUTION) ×3 IMPLANT
PACK EXTREMITY ARMC (MISCELLANEOUS) ×3 IMPLANT
PAD CAST CTTN 4X4 STRL (SOFTGOODS) ×1 IMPLANT
PADDING CAST COTTON 4X4 STRL (SOFTGOODS) ×2
STOCKINETTE STRL 4IN 9604848 (GAUZE/BANDAGES/DRESSINGS) ×3 IMPLANT
SUT ETHILON 4-0 (SUTURE) ×2
SUT ETHILON 4-0 FS2 18XMFL BLK (SUTURE) ×1
SUTURE ETHLN 4-0 FS2 18XMF BLK (SUTURE) ×1 IMPLANT

## 2015-04-27 NOTE — Discharge Instructions (Addendum)
Work fingers is much as possible. Keep dressing clean and dry. Loosen Ace wrap if fingers swell   AMBULATORY SURGERY  DISCHARGE INSTRUCTIONS   1) The drugs that you were given will stay in your system until tomorrow so for the next 24 hours you should not:  A) Drive an automobile B) Make any legal decisions C) Drink any alcoholic beverage   2) You may resume regular meals tomorrow.  Today it is better to start with liquids and gradually work up to solid foods.  You may eat anything you prefer, but it is better to start with liquids, then soup and crackers, and gradually work up to solid foods.   3) Please notify your doctor immediately if you have any unusual bleeding, trouble breathing, redness and pain at the surgery site, drainage, fever, or pain not relieved by medication.   Please contact your physician with any problems or Same Day Surgery at [phone], Monday through Friday 6 am to 4 pm, or [place] [facility] number at [phone].  Carpal Tunnel Release, Care After Refer to this sheet in the next few weeks. These instructions provide you with information about caring for yourself after your procedure. Your health care provider may also give you more specific instructions. Your treatment has been planned according to current medical practices, but problems sometimes occur. Call your health care provider if you have any problems or questions after your procedure. WHAT TO EXPECT AFTER THE PROCEDURE After your procedure, it is typical to have the following:  Pain.  Numbness.  Tingling.  Swelling.  Stiffness.  Bruising. HOME CARE INSTRUCTIONS  Take medicines only as directed by your health care provider.  There are many different ways to close and cover an incision, including stitches (sutures), skin glue, and adhesive strips. Follow your health care provider's instructions about:  Incision care.  Bandage (dressing) changes and removal.  Incision  closure removal.  Wear a splint or a brace as directed by your surgeon. You may need to do this for 2-3 weeks.  Keep your hand raised (elevated) above the level of your heart while you are resting. Move your fingers often.  Avoid activities that cause hand pain.  Ask your surgeon when you can start to do all of your usual activities again, such as:  Driving.  Returning to work.  Bathing and swimming.  Keep all follow-up visits as directed by your health care provider. This is important. You may need physical therapy for several months to speed healing and regain movement. SEEK MEDICAL CARE IF:  You have drainage, redness, swelling, or pain at your incision site.  You have a fever.  You have chills.  Your pain medicine is not working.  Your symptoms do not go away after 2 months.  Your symptoms go away and then return. SEEK IMMEDIATE MEDICAL CARE IF:  You have pain or numbness that is getting worse.  Your fingers change color.  You are not able to move your fingers.   This information is not intended to replace advice given to you by your health care provider. Make sure you discuss any questions you have with your health care provider.   Document Released: 09/02/2004 Document Revised: 03/06/2014 Document Reviewed: 10/01/2013 Elsevier Interactive Patient Education [employer].

## 2015-04-27 NOTE — Anesthesia Postprocedure Evaluation (Signed)
Anesthesia Post Note  Patient: Madeline Cunningham  Procedure(s) Performed: Procedure(s) (LRB): CARPAL TUNNEL RELEASE (Right)  Patient location during evaluation: PACU Anesthesia Type: General Level of consciousness: awake and alert Pain management: pain level controlled Vital Signs Assessment: post-procedure vital signs reviewed and stable Respiratory status: spontaneous breathing and respiratory function stable Cardiovascular status: stable Anesthetic complications: no    Last Vitals:  Filed Vitals:   04/27/15 1105 04/27/15 1120  BP: 120/58 130/61  Pulse: 74 72  Temp: 36.1 C   Resp: 19 12    Last Pain:  Filed Vitals:   04/27/15 1122  PainSc: Asleep                 KEPHART,WILLIAM K

## 2015-04-27 NOTE — Transfer of Care (Signed)
Immediate Anesthesia Transfer of Care Note  Patient: Madeline Cunningham  Procedure(s) Performed: Procedure(s): CARPAL TUNNEL RELEASE (Right)  Patient Location: PACU  Anesthesia Type:General  Level of Consciousness: sedated  Airway & Oxygen Therapy: Patient Spontanous Breathing and Patient connected to face mask oxygen  Post-op Assessment: Report given to RN  Post vital signs: Reviewed  Last Vitals:  Filed Vitals:   04/27/15 0822 04/27/15 1105  BP: 151/77 120/58  Pulse: 59 74  Temp: 35.4 C 36.1 C  Resp: 16 19    Complications: No apparent anesthesia complications

## 2015-04-27 NOTE — Op Note (Signed)
04/27/2015  11:07 AM  PATIENT:  Madeline Cunningham  82 y.o. female  PRE-OPERATIVE DIAGNOSIS:  CARPAL TUNNEL SYNDROME right  POST-OPERATIVE DIAGNOSIS:  CARPAL TUNNEL SYNDROME right  PROCEDURE:  Procedure(s): CARPAL TUNNEL RELEASE (Right)  SURGEON: Laurene Footman, MD  ASSISTANTS: None  ANESTHESIA:   general  EBL:  Total I/O In: 400 [I.V.:400] Out: -   BLOOD ADMINISTERED:none  DRAINS: none   LOCAL MEDICATIONS USED:  BUPIVICAINE   SPECIMEN:  No Specimen  DISPOSITION OF SPECIMEN:  N/A  COUNTS:  YES  TOURNIQUET:   13 minutes at 250 mmHg  IMPLANTS: None  DICTATION: .Dragon Dictation patient was brought to the operating room and after adequate general anesthesia was obtained the right arm was prepped and draped in usual sterile fashion. After patient identification and timeout procedures were completed, the tourniquet was raised. Incision in line with the ring metacarpal was carried out approximate 2 cm in length. The skin and subcutaneous tissues tissue were spread and the transverse carpal ligament identified. It was opened distally with scalpel and a vascular hemostat placed to protect the underlying structures. Release care distally until fat was noted around the nerve and then proximally to the wrist flexion crease. There was compression in the middle portion of the carpal tunnel with a thickened ligament as well as moderate tenosynovium right wrist. The area of compression was identified and there appeared to be complete release. There is good vascular blush to the nerve was some hourglass constriction at the area of compression. This point the wound was irrigated and 10 cc of half percent Sensorcaine infiltrated in the area around the incision to aid in postop analgesia. Skin was then closed with simple interrupted 5-0 nylon. Xeroform 4 x 4 web roll and Ace wrap applied  PLAN OF CARE: Discharge to home after PACU  PATIENT DISPOSITION:  PACU - hemodynamically stable.

## 2015-04-27 NOTE — H&P (Signed)
Reviewed paper H+P, will be scanned into chart. No changes noted.  

## 2015-04-27 NOTE — Anesthesia Procedure Notes (Signed)
Procedure Name: LMA Insertion Performed by: Landon Truax Pre-anesthesia Checklist: Patient identified, Patient being monitored, Timeout performed, Emergency Drugs available and Suction available Patient Re-evaluated:Patient Re-evaluated prior to inductionOxygen Delivery Method: Circle system utilized Preoxygenation: Pre-oxygenation with 100% oxygen Intubation Type: IV induction Ventilation: Mask ventilation without difficulty LMA: LMA inserted LMA Size: 4.0 Tube type: Oral Number of attempts: 1 Placement Confirmation: positive ETCO2 and breath sounds checked- equal and bilateral Tube secured with: Tape Dental Injury: Teeth and Oropharynx as per pre-operative assessment      

## 2015-04-27 NOTE — Anesthesia Preprocedure Evaluation (Addendum)
Anesthesia Evaluation  Patient identified by MRN, date of birth, ID band Patient awake    Reviewed: Allergy & Precautions, NPO status , Patient's Chart, lab work & pertinent test results, reviewed documented beta blocker date and time   History of Anesthesia Complications Negative for: history of anesthetic complications  Airway Mallampati: III       Dental  (+) Edentulous Upper, Edentulous Lower   Pulmonary neg pulmonary ROS, sleep apnea ,  COPD inhaler and oxygen dependent, former smoker,           Cardiovascular hypertension, Pt. on medications and Pt. on home beta blockers + CAD  + dysrhythmias      Neuro/Psych Depression CVA (speech affected, no residual), No Residual Symptoms negative neurological ROS     GI/Hepatic negative GI ROS, Neg liver ROS,   Endo/Other  negative endocrine ROS  Renal/GU negative Renal ROS     Musculoskeletal  (+) Arthritis , Osteoarthritis,    Abdominal   Peds  Hematology negative hematology ROS (+)   Anesthesia Other Findings   Reproductive/Obstetrics                           Anesthesia Physical Anesthesia Plan  ASA: III  Anesthesia Plan: General   Post-op Pain Management:    Induction: Intravenous  Airway Management Planned: LMA  Additional Equipment:   Intra-op Plan:   Post-operative Plan:   Informed Consent: I have reviewed the patients History and Physical, chart, labs and discussed the procedure including the risks, benefits and alternatives for the proposed anesthesia with the patient or authorized representative who has indicated his/her understanding and acceptance.     Plan Discussed with:   Anesthesia Plan Comments:         Anesthesia Quick Evaluation

## 2015-10-27 ENCOUNTER — Ambulatory Visit
Admission: RE | Admit: 2015-10-27 | Discharge: 2015-10-27 | Disposition: A | Discharge status: Home / Self Care | Payer: Medicare Other | Source: Ambulatory Visit | Attending: Physician Assistant | Admitting: Physician Assistant

## 2015-10-27 ENCOUNTER — Other Ambulatory Visit: Payer: Self-pay | Admitting: Physician Assistant

## 2015-10-27 DIAGNOSIS — M7122 Synovial cyst of popliteal space [Baker], left knee: Secondary | ICD-10-CM | POA: Insufficient documentation

## 2015-10-27 DIAGNOSIS — M7989 Other specified soft tissue disorders: Secondary | ICD-10-CM

## 2016-04-12 ENCOUNTER — Other Ambulatory Visit: Payer: Self-pay | Admitting: Student

## 2016-04-12 DIAGNOSIS — Z96642 Presence of left artificial hip joint: Secondary | ICD-10-CM

## 2016-04-12 DIAGNOSIS — M25552 Pain in left hip: Secondary | ICD-10-CM

## 2016-04-20 ENCOUNTER — Other Ambulatory Visit: Payer: Self-pay | Admitting: Student

## 2016-04-20 ENCOUNTER — Ambulatory Visit: Payer: Medicare Other

## 2016-04-20 ENCOUNTER — Ambulatory Visit
Admission: RE | Admit: 2016-04-20 | Discharge: 2016-04-20 | Disposition: A | Discharge status: Home / Self Care | Payer: Medicare Other | Source: Ambulatory Visit | Attending: Student | Admitting: Student

## 2016-04-20 DIAGNOSIS — R937 Abnormal findings on diagnostic imaging of other parts of musculoskeletal system: Secondary | ICD-10-CM | POA: Diagnosis not present

## 2016-04-20 DIAGNOSIS — Z96642 Presence of left artificial hip joint: Secondary | ICD-10-CM

## 2016-04-20 DIAGNOSIS — M25552 Pain in left hip: Secondary | ICD-10-CM

## 2016-04-20 HISTORY — DX: Malignant (primary) neoplasm, unspecified: C80.1

## 2016-04-20 MED ORDER — IOPAMIDOL (ISOVUE-300) INJECTION 61%
75.0000 mL | Freq: Once | INTRAVENOUS | Status: AC | PRN
  Administered 2016-04-20: 75 mL via INTRAVENOUS

## 2016-04-21 ENCOUNTER — Other Ambulatory Visit: Payer: Self-pay | Admitting: Student

## 2016-04-21 ENCOUNTER — Other Ambulatory Visit: Payer: Self-pay | Admitting: Surgery

## 2016-04-21 DIAGNOSIS — M25552 Pain in left hip: Secondary | ICD-10-CM

## 2016-04-21 DIAGNOSIS — Z96642 Presence of left artificial hip joint: Secondary | ICD-10-CM

## 2016-04-24 ENCOUNTER — Ambulatory Visit: Payer: Medicare Other

## 2016-04-27 ENCOUNTER — Ambulatory Visit
Admission: RE | Admit: 2016-04-27 | Discharge: 2016-04-27 | Disposition: A | Discharge status: Home / Self Care | Payer: Medicare Other | Source: Ambulatory Visit | Attending: Student | Admitting: Student

## 2016-04-27 DIAGNOSIS — Z96649 Presence of unspecified artificial hip joint: Secondary | ICD-10-CM | POA: Diagnosis present

## 2016-04-27 DIAGNOSIS — Z96642 Presence of left artificial hip joint: Secondary | ICD-10-CM

## 2016-04-27 DIAGNOSIS — M25552 Pain in left hip: Secondary | ICD-10-CM | POA: Insufficient documentation

## 2016-04-27 LAB — PROTIME-INR
INR: 1.2
PROTHROMBIN TIME: 15.3 s — AB (ref 11.4–15.2)

## 2016-04-27 LAB — BODY FLUID CELL COUNT WITH DIFFERENTIAL
Eos, Fluid: 0 %
Lymphs, Fluid: 69 %
Monocyte-Macrophage-Serous Fluid: 28 %
Neutrophil Count, Fluid: 3 %
Total Nucleated Cell Count, Fluid: 4205 cu mm

## 2016-04-30 LAB — BODY FLUID CULTURE: Culture: NO GROWTH

## 2016-06-09 LAB — ACID FAST CULTURE WITH REFLEXED SENSITIVITIES: ACID FAST CULTURE - AFSCU3: NEGATIVE

## 2017-05-05 ENCOUNTER — Other Ambulatory Visit (INDEPENDENT_AMBULATORY_CARE_PROVIDER_SITE_OTHER): Payer: Self-pay | Admitting: Vascular Surgery

## 2017-05-05 ENCOUNTER — Emergency Department: Payer: Medicare Other

## 2017-05-05 ENCOUNTER — Encounter
Admission: EM | Disposition: A | Discharge status: Skilled Nursing Facility | Payer: Self-pay | Source: Home / Self Care | Attending: Vascular Surgery

## 2017-05-05 ENCOUNTER — Other Ambulatory Visit: Payer: Self-pay

## 2017-05-05 ENCOUNTER — Inpatient Hospital Stay
Admission: EM | Admit: 2017-05-05 | Discharge: 2017-05-11 | DRG: 272 | Disposition: A | Discharge status: Skilled Nursing Facility | Payer: Medicare Other | Attending: Vascular Surgery | Admitting: Vascular Surgery

## 2017-05-05 DIAGNOSIS — Z9049 Acquired absence of other specified parts of digestive tract: Secondary | ICD-10-CM | POA: Diagnosis not present

## 2017-05-05 DIAGNOSIS — I739 Peripheral vascular disease, unspecified: Secondary | ICD-10-CM | POA: Diagnosis present

## 2017-05-05 DIAGNOSIS — I998 Other disorder of circulatory system: Secondary | ICD-10-CM | POA: Diagnosis present

## 2017-05-05 DIAGNOSIS — Z7901 Long term (current) use of anticoagulants: Secondary | ICD-10-CM | POA: Diagnosis not present

## 2017-05-05 DIAGNOSIS — G4730 Sleep apnea, unspecified: Secondary | ICD-10-CM | POA: Diagnosis present

## 2017-05-05 DIAGNOSIS — R209 Unspecified disturbances of skin sensation: Secondary | ICD-10-CM

## 2017-05-05 DIAGNOSIS — I129 Hypertensive chronic kidney disease with stage 1 through stage 4 chronic kidney disease, or unspecified chronic kidney disease: Secondary | ICD-10-CM | POA: Diagnosis present

## 2017-05-05 DIAGNOSIS — I2510 Atherosclerotic heart disease of native coronary artery without angina pectoris: Secondary | ICD-10-CM | POA: Diagnosis present

## 2017-05-05 DIAGNOSIS — Z9841 Cataract extraction status, right eye: Secondary | ICD-10-CM | POA: Diagnosis not present

## 2017-05-05 DIAGNOSIS — R791 Abnormal coagulation profile: Secondary | ICD-10-CM | POA: Diagnosis present

## 2017-05-05 DIAGNOSIS — E8770 Fluid overload, unspecified: Secondary | ICD-10-CM | POA: Diagnosis present

## 2017-05-05 DIAGNOSIS — I482 Chronic atrial fibrillation: Secondary | ICD-10-CM | POA: Diagnosis present

## 2017-05-05 DIAGNOSIS — Z87891 Personal history of nicotine dependence: Secondary | ICD-10-CM | POA: Diagnosis not present

## 2017-05-05 DIAGNOSIS — M79605 Pain in left leg: Secondary | ICD-10-CM | POA: Diagnosis present

## 2017-05-05 DIAGNOSIS — T45516A Underdosing of anticoagulants, initial encounter: Secondary | ICD-10-CM | POA: Diagnosis present

## 2017-05-05 DIAGNOSIS — J439 Emphysema, unspecified: Secondary | ICD-10-CM | POA: Diagnosis present

## 2017-05-05 DIAGNOSIS — Z96642 Presence of left artificial hip joint: Secondary | ICD-10-CM | POA: Diagnosis present

## 2017-05-05 DIAGNOSIS — Z9842 Cataract extraction status, left eye: Secondary | ICD-10-CM

## 2017-05-05 DIAGNOSIS — Z888 Allergy status to other drugs, medicaments and biological substances status: Secondary | ICD-10-CM

## 2017-05-05 DIAGNOSIS — I743 Embolism and thrombosis of arteries of the lower extremities: Principal | ICD-10-CM | POA: Diagnosis present

## 2017-05-05 DIAGNOSIS — N183 Chronic kidney disease, stage 3 (moderate): Secondary | ICD-10-CM | POA: Diagnosis present

## 2017-05-05 DIAGNOSIS — E785 Hyperlipidemia, unspecified: Secondary | ICD-10-CM | POA: Diagnosis not present

## 2017-05-05 DIAGNOSIS — F329 Major depressive disorder, single episode, unspecified: Secondary | ICD-10-CM | POA: Diagnosis present

## 2017-05-05 DIAGNOSIS — Z79891 Long term (current) use of opiate analgesic: Secondary | ICD-10-CM | POA: Diagnosis not present

## 2017-05-05 DIAGNOSIS — I7090 Unspecified atherosclerosis: Secondary | ICD-10-CM

## 2017-05-05 DIAGNOSIS — Z91128 Patient's intentional underdosing of medication regimen for other reason: Secondary | ICD-10-CM

## 2017-05-05 DIAGNOSIS — M62262 Nontraumatic ischemic infarction of muscle, left lower leg: Secondary | ICD-10-CM | POA: Diagnosis present

## 2017-05-05 DIAGNOSIS — Z961 Presence of intraocular lens: Secondary | ICD-10-CM | POA: Diagnosis present

## 2017-05-05 DIAGNOSIS — I10 Essential (primary) hypertension: Secondary | ICD-10-CM | POA: Diagnosis not present

## 2017-05-05 DIAGNOSIS — Z79899 Other long term (current) drug therapy: Secondary | ICD-10-CM | POA: Diagnosis not present

## 2017-05-05 DIAGNOSIS — J449 Chronic obstructive pulmonary disease, unspecified: Secondary | ICD-10-CM

## 2017-05-05 HISTORY — PX: LOWER EXTREMITY INTERVENTION: CATH118252

## 2017-05-05 HISTORY — PX: LOWER EXTREMITY ANGIOGRAPHY: CATH118251

## 2017-05-05 LAB — BASIC METABOLIC PANEL
ANION GAP: 10 (ref 5–15)
BUN: 44 mg/dL — ABNORMAL HIGH (ref 6–20)
CHLORIDE: 104 mmol/L (ref 101–111)
CO2: 25 mmol/L (ref 22–32)
Calcium: 9.1 mg/dL (ref 8.9–10.3)
Creatinine, Ser: 1.38 mg/dL — ABNORMAL HIGH (ref 0.44–1.00)
GFR calc non Af Amer: 34 mL/min — ABNORMAL LOW (ref >60)
GFR, EST AFRICAN AMERICAN: 39 mL/min — AB (ref >60)
Glucose, Bld: 124 mg/dL — ABNORMAL HIGH (ref 65–99)
POTASSIUM: 3.6 mmol/L (ref 3.5–5.1)
Sodium: 139 mmol/L (ref 135–145)

## 2017-05-05 LAB — CBC WITH DIFFERENTIAL/PLATELET
BASOS ABS: 0.1 10*3/uL (ref 0–0.1)
BASOS PCT: 1 %
Eosinophils Absolute: 0.2 10*3/uL (ref 0–0.7)
Eosinophils Relative: 3 %
HEMATOCRIT: 44.6 % (ref 35.0–47.0)
HEMOGLOBIN: 14.3 g/dL (ref 12.0–16.0)
Lymphocytes Relative: 22 %
Lymphs Abs: 1.5 10*3/uL (ref 1.0–3.6)
MCH: 29.1 pg (ref 26.0–34.0)
MCHC: 32.1 g/dL (ref 32.0–36.0)
MCV: 90.6 fL (ref 80.0–100.0)
MONOS PCT: 14 %
Monocytes Absolute: 1 10*3/uL — ABNORMAL HIGH (ref 0.2–0.9)
NEUTROS ABS: 4 10*3/uL (ref 1.4–6.5)
NEUTROS PCT: 60 %
Platelets: 212 10*3/uL (ref 150–440)
RBC: 4.92 MIL/uL (ref 3.80–5.20)
RDW: 15.5 % — ABNORMAL HIGH (ref 11.5–14.5)
WBC: 6.7 10*3/uL (ref 3.6–11.0)

## 2017-05-05 LAB — PROTIME-INR
INR: 1.26
Prothrombin Time: 15.7 seconds — ABNORMAL HIGH (ref 11.4–15.2)

## 2017-05-05 LAB — MRSA PCR SCREENING: MRSA BY PCR: POSITIVE — AB

## 2017-05-05 LAB — GLUCOSE, CAPILLARY: Glucose-Capillary: 119 mg/dL — ABNORMAL HIGH (ref 65–99)

## 2017-05-05 LAB — APTT: APTT: 31 s (ref 24–36)

## 2017-05-05 SURGERY — LOWER EXTREMITY ANGIOGRAPHY
Anesthesia: Moderate Sedation

## 2017-05-05 SURGERY — PERIPHERAL VASCULAR THROMBECTOMY
Anesthesia: Moderate Sedation | Laterality: Left

## 2017-05-05 MED ORDER — TIROFIBAN (AGGRASTAT) BOLUS VIA INFUSION: 25.0000 ug/kg | Freq: Once | INTRAVENOUS | Status: DC

## 2017-05-05 MED ORDER — CLOPIDOGREL BISULFATE 75 MG PO TABS
75.0000 mg | ORAL_TABLET | Freq: Every day | ORAL | Status: DC
  Administered 2017-05-06 – 2017-05-11 (×6): 75 mg via ORAL
  Filled 2017-05-05 (×6): qty 1

## 2017-05-05 MED ORDER — MIDAZOLAM HCL 5 MG/5ML IJ SOLN
INTRAMUSCULAR | Status: AC
  Filled 2017-05-05: qty 5

## 2017-05-05 MED ORDER — BUSPIRONE HCL 10 MG PO TABS
10.0000 mg | ORAL_TABLET | Freq: Two times a day (BID) | ORAL | Status: DC
  Administered 2017-05-05 – 2017-05-11 (×11): 10 mg via ORAL
  Filled 2017-05-05 (×13): qty 1

## 2017-05-05 MED ORDER — SODIUM CHLORIDE 0.9% FLUSH
3.0000 mL | INTRAVENOUS | Status: DC | PRN
  Administered 2017-05-07 (×2): 3 mL via INTRAVENOUS
  Filled 2017-05-05 (×2): qty 3

## 2017-05-05 MED ORDER — ONDANSETRON HCL 4 MG/2ML IJ SOLN: 4.0000 mg | Freq: Four times a day (QID) | INTRAMUSCULAR | Status: DC | PRN

## 2017-05-05 MED ORDER — METHYLPREDNISOLONE SODIUM SUCC 125 MG IJ SOLR: 125.0000 mg | INTRAMUSCULAR | Status: DC | PRN

## 2017-05-05 MED ORDER — TIROFIBAN (AGGRASTAT) BOLUS VIA INFUSION
12.5000 ug/kg | Freq: Once | INTRAVENOUS | Status: AC
  Administered 2017-05-05: 1251.25 ug via INTRAVENOUS
  Filled 2017-05-05 (×2): qty 26

## 2017-05-05 MED ORDER — METOPROLOL SUCCINATE ER 100 MG PO TB24
200.0000 mg | ORAL_TABLET | Freq: Every day | ORAL | Status: DC
  Administered 2017-05-05 – 2017-05-10 (×5): 200 mg via ORAL
  Filled 2017-05-05: qty 4
  Filled 2017-05-05 (×4): qty 2

## 2017-05-05 MED ORDER — WARFARIN SODIUM 4 MG PO TABS
4.0000 mg | ORAL_TABLET | Freq: Every day | ORAL | Status: DC
  Administered 2017-05-05: 4 mg via ORAL
  Filled 2017-05-05 (×2): qty 1

## 2017-05-05 MED ORDER — FENTANYL CITRATE (PF) 100 MCG/2ML IJ SOLN
INTRAMUSCULAR | Status: AC
  Filled 2017-05-05: qty 2

## 2017-05-05 MED ORDER — CEFAZOLIN SODIUM-DEXTROSE 2-4 GM/100ML-% IV SOLN
2.0000 g | Freq: Once | INTRAVENOUS | Status: DC
  Administered 2017-05-05: 1 g via INTRAVENOUS

## 2017-05-05 MED ORDER — HEPARIN (PORCINE) IN NACL 100-0.45 UNIT/ML-% IJ SOLN
1200.0000 [IU]/h | INTRAMUSCULAR | Status: DC
  Administered 2017-05-05: 1200 [IU]/h via INTRAVENOUS
  Filled 2017-05-05 (×2): qty 250

## 2017-05-05 MED ORDER — FENTANYL CITRATE (PF) 100 MCG/2ML IJ SOLN
INTRAMUSCULAR | Status: DC | PRN
  Administered 2017-05-05 (×5): 25 ug via INTRAVENOUS

## 2017-05-05 MED ORDER — CHLORHEXIDINE GLUCONATE CLOTH 2 % EX PADS
6.0000 | MEDICATED_PAD | Freq: Every day | CUTANEOUS | Status: AC
  Administered 2017-05-06 – 2017-05-08 (×3): 6 via TOPICAL

## 2017-05-05 MED ORDER — OXYCODONE HCL 5 MG PO TABS: 5.0000 mg | ORAL_TABLET | Freq: Four times a day (QID) | ORAL | Status: DC | PRN

## 2017-05-05 MED ORDER — SODIUM CHLORIDE 0.9 % IV SOLN
INTRAVENOUS | Status: DC
  Administered 2017-05-05: 15:00:00 via INTRAVENOUS

## 2017-05-05 MED ORDER — CEFAZOLIN SODIUM-DEXTROSE 1-4 GM/50ML-% IV SOLN: 1.0000 g | Freq: Once | INTRAVENOUS | Status: DC

## 2017-05-05 MED ORDER — ROPINIROLE HCL 1 MG PO TABS
5.0000 mg | ORAL_TABLET | Freq: Every day | ORAL | Status: DC
  Administered 2017-05-05 – 2017-05-10 (×4): 5 mg via ORAL
  Filled 2017-05-05 (×7): qty 5

## 2017-05-05 MED ORDER — HEPARIN (PORCINE) IN NACL 2-0.9 UNIT/ML-% IJ SOLN
INTRAMUSCULAR | Status: AC
  Filled 2017-05-05: qty 1000

## 2017-05-05 MED ORDER — IOPAMIDOL (ISOVUE-300) INJECTION 61%
INTRAVENOUS | Status: DC | PRN
  Administered 2017-05-05: 50 mL via INTRAVENOUS

## 2017-05-05 MED ORDER — FUROSEMIDE 20 MG PO TABS
40.0000 mg | ORAL_TABLET | Freq: Every day | ORAL | Status: DC
  Administered 2017-05-06 – 2017-05-11 (×6): 40 mg via ORAL
  Filled 2017-05-05 (×6): qty 2

## 2017-05-05 MED ORDER — ALTEPLASE 2 MG IJ SOLR
INTRAMUSCULAR | Status: AC
  Filled 2017-05-05: qty 10

## 2017-05-05 MED ORDER — MIDAZOLAM HCL 2 MG/2ML IJ SOLN
INTRAMUSCULAR | Status: DC | PRN
  Administered 2017-05-05: 1 mg via INTRAVENOUS
  Administered 2017-05-05: 1.5 mg via INTRAVENOUS
  Administered 2017-05-05 (×2): 1 mg via INTRAVENOUS
  Administered 2017-05-05: 0.5 mg via INTRAVENOUS

## 2017-05-05 MED ORDER — IOPAMIDOL (ISOVUE-370) INJECTION 76%
125.0000 mL | Freq: Once | INTRAVENOUS | Status: AC | PRN
  Administered 2017-05-05: 125 mL via INTRAVENOUS

## 2017-05-05 MED ORDER — IPRATROPIUM-ALBUTEROL 0.5-2.5 (3) MG/3ML IN SOLN: 3.0000 mL | Freq: Four times a day (QID) | RESPIRATORY_TRACT | Status: DC | PRN

## 2017-05-05 MED ORDER — LABETALOL HCL 5 MG/ML IV SOLN: 10.0000 mg | INTRAVENOUS | Status: DC | PRN

## 2017-05-05 MED ORDER — LORAZEPAM 0.5 MG PO TABS
0.5000 mg | ORAL_TABLET | Freq: Two times a day (BID) | ORAL | Status: DC | PRN
  Filled 2017-05-05 (×2): qty 1

## 2017-05-05 MED ORDER — NITROGLYCERIN 0.4 MG SL SUBL: 0.4000 mg | SUBLINGUAL_TABLET | SUBLINGUAL | Status: DC | PRN

## 2017-05-05 MED ORDER — ACETAMINOPHEN 325 MG PO TABS
650.0000 mg | ORAL_TABLET | ORAL | Status: DC | PRN
  Administered 2017-05-07: 650 mg via ORAL
  Filled 2017-05-05: qty 2

## 2017-05-05 MED ORDER — LIDOCAINE HCL (PF) 1 % IJ SOLN
INTRAMUSCULAR | Status: AC
  Filled 2017-05-05: qty 30

## 2017-05-05 MED ORDER — DILTIAZEM HCL ER COATED BEADS 120 MG PO CP24
120.0000 mg | ORAL_CAPSULE | Freq: Every day | ORAL | Status: DC
  Administered 2017-05-06 – 2017-05-11 (×6): 120 mg via ORAL
  Filled 2017-05-05 (×6): qty 1

## 2017-05-05 MED ORDER — FAMOTIDINE 20 MG PO TABS: 40.0000 mg | ORAL_TABLET | ORAL | Status: DC | PRN

## 2017-05-05 MED ORDER — SODIUM CHLORIDE 0.9% FLUSH
3.0000 mL | Freq: Two times a day (BID) | INTRAVENOUS | Status: DC
  Administered 2017-05-05 – 2017-05-11 (×11): 3 mL via INTRAVENOUS

## 2017-05-05 MED ORDER — TIROFIBAN HCL IN NACL 5-0.9 MG/100ML-% IV SOLN
0.0750 ug/kg/min | INTRAVENOUS | Status: DC
  Administered 2017-05-05 (×2): 0.075 ug/kg/min via INTRAVENOUS
  Filled 2017-05-05 (×5): qty 100

## 2017-05-05 MED ORDER — MUPIROCIN 2 % EX OINT
1.0000 "application " | TOPICAL_OINTMENT | Freq: Two times a day (BID) | CUTANEOUS | Status: AC
  Administered 2017-05-05 – 2017-05-10 (×9): 1 via NASAL
  Filled 2017-05-05 (×2): qty 22

## 2017-05-05 MED ORDER — WARFARIN - PHYSICIAN DOSING INPATIENT
Freq: Every day | Status: DC
  Administered 2017-05-05: 19:00:00

## 2017-05-05 MED ORDER — HEPARIN BOLUS VIA INFUSION
4000.0000 [IU] | Freq: Once | INTRAVENOUS | Status: AC
  Administered 2017-05-05: 4000 [IU] via INTRAVENOUS
  Filled 2017-05-05: qty 4000

## 2017-05-05 MED ORDER — SODIUM CHLORIDE 0.9 % IV SOLN: 250.0000 mL | INTRAVENOUS | Status: DC | PRN

## 2017-05-05 MED ORDER — FLUOXETINE HCL 20 MG PO CAPS
20.0000 mg | ORAL_CAPSULE | ORAL | Status: DC
  Administered 2017-05-06 – 2017-05-11 (×6): 20 mg via ORAL
  Filled 2017-05-05 (×8): qty 1

## 2017-05-05 MED ORDER — HEPARIN SODIUM (PORCINE) 1000 UNIT/ML IJ SOLN
INTRAMUSCULAR | Status: DC | PRN
  Administered 2017-05-05: 1000 [IU] via INTRAVENOUS

## 2017-05-05 MED ORDER — ALTEPLASE 2 MG IJ SOLR
INTRAMUSCULAR | Status: DC | PRN
  Administered 2017-05-05: 6 mg

## 2017-05-05 MED ORDER — HEPARIN SODIUM (PORCINE) 1000 UNIT/ML IJ SOLN
INTRAMUSCULAR | Status: AC
  Filled 2017-05-05: qty 1

## 2017-05-05 MED ORDER — MORPHINE SULFATE (PF) 4 MG/ML IV SOLN: 1.0000 mg | INTRAVENOUS | Status: DC | PRN

## 2017-05-05 MED ORDER — GABAPENTIN 100 MG PO CAPS
200.0000 mg | ORAL_CAPSULE | Freq: Three times a day (TID) | ORAL | Status: DC
  Administered 2017-05-05 – 2017-05-11 (×17): 200 mg via ORAL
  Filled 2017-05-05 (×16): qty 2

## 2017-05-05 MED ORDER — HYDRALAZINE HCL 20 MG/ML IJ SOLN: 5.0000 mg | INTRAMUSCULAR | Status: DC | PRN

## 2017-05-05 MED ORDER — PRAVASTATIN SODIUM 40 MG PO TABS
80.0000 mg | ORAL_TABLET | Freq: Every day | ORAL | Status: DC
  Administered 2017-05-05 – 2017-05-10 (×5): 80 mg via ORAL
  Filled 2017-05-05: qty 2
  Filled 2017-05-05: qty 1
  Filled 2017-05-05 (×4): qty 2
  Filled 2017-05-05: qty 4

## 2017-05-05 MED ORDER — SODIUM CHLORIDE 0.9 % IV SOLN: INTRAVENOUS | Status: AC

## 2017-05-05 MED ORDER — PANTOPRAZOLE SODIUM 40 MG IV SOLR
40.0000 mg | Freq: Every day | INTRAVENOUS | Status: DC
  Administered 2017-05-05: 40 mg via INTRAVENOUS
  Filled 2017-05-05: qty 40

## 2017-05-05 SURGICAL SUPPLY — 22 items
CANISTER PENUMBRA MAX (MISCELLANEOUS) ×3 IMPLANT
CATH BEACON 5 .038 100 VERT TP (CATHETERS) ×3 IMPLANT
CATH INDIGO 3 ST-TIP 150CM (CATHETERS) ×3 IMPLANT
CATH INDIGO 6 ST-TIP 135CM (CATHETERS) ×3 IMPLANT
CATH INFUS 90CMX10CM (CATHETERS) ×3 IMPLANT
CATH PIG 70CM (CATHETERS) ×3 IMPLANT
DEVICE SAFEGUARD 24CM (GAUZE/BANDAGES/DRESSINGS) ×3 IMPLANT
DEVICE STARCLOSE SE CLOSURE (Vascular Products) ×3 IMPLANT
DEVICE TORQUE (MISCELLANEOUS) ×3 IMPLANT
GLIDEWIRE ADV .014X300CM (WIRE) ×3 IMPLANT
GLIDEWIRE STIFF .35X180X3 HYDR (WIRE) ×3 IMPLANT
GUIDEWIRE ANGLED .035X260CM (WIRE) ×3 IMPLANT
NEEDLE ENTRY 21GA 7CM ECHOTIP (NEEDLE) ×3 IMPLANT
PACK ANGIOGRAPHY (CUSTOM PROCEDURE TRAY) ×3 IMPLANT
SET INTRO CAPELLA COAXIAL (SET/KITS/TRAYS/PACK) ×3 IMPLANT
SHEATH BRITE TIP 5FRX11 (SHEATH) ×3 IMPLANT
SHEATH FLEXOR ANSEL2 7FRX45 (SHEATH) ×3 IMPLANT
SYR MEDRAD MARK V 150ML (SYRINGE) ×3 IMPLANT
TUBING ASPIRATION INDIGO (MISCELLANEOUS) ×3 IMPLANT
TUBING CONTRAST HIGH PRESS 72 (TUBING) ×3 IMPLANT
WIRE J 3MM .035X145CM (WIRE) ×3 IMPLANT
WIRE MAGIC TORQUE 260C (WIRE) ×3 IMPLANT

## 2017-05-05 NOTE — Progress Notes (Signed)
[place] for tirofiban Indication: Ischemic Leg  Allergies  Allergen Reactions  . Promethazine Anaphylaxis  . Prempro [Conj Estrog-Medroxyprogest Ace] Other (See Comments)    Pin in my breast  . Evista [Raloxifene] Other (See Comments)    Breast pain  . Zoloft [Sertraline Hcl] Other (See Comments)    Pt does not remember what type of reaction.    Patient Measurements: Height: 5\' 5"  (165.1 cm) Weight: 220 lb 10.9 oz (100.1 kg) IBW/kg (Calculated) : 57  Vital Signs: Temp: 97.7 F (36.5 C) (03/09 1631) Temp Source: Oral (03/09 1631) BP: 114/99 (03/09 1631) Pulse Rate: 64 (03/09 1631)  Labs: Recent Labs    05/05/17 1135  HGB 14.3  HCT 44.6  PLT 212  APTT 31  LABPROT 15.7*  INR 1.26  CREATININE 1.38*    Estimated Creatinine Clearance: 34.9 mL/min (A) (by C-G formula based on SCr of 1.38 mg/dL (H)).   Medical History: Past Medical History:  Diagnosis Date  . Arthritis   . Asthma   . Atrial fibrillation, unspecified   . Cancer (HCC)    rt side  . COPD (chronic obstructive pulmonary disease) ([place])   . Coronary artery disease   . Depression   . Dysrhythmia    A-fib  . Sleep apnea     Assessment: 84 y/o F s/p LE angiography ordered tirofiban.   Plan:  After discussion with Dr. Delana Meyer, will order half-bolus of 12.5 mcg/kg. Will initiate infusion at 0.075 mcg/kg/min with CrCl < 60 ml/min.   Ulice Dash D 05/05/2017,4:58 PM

## 2017-05-05 NOTE — ED Notes (Signed)
Family at bedside. Patient taken to CT scan.

## 2017-05-05 NOTE — ED Notes (Addendum)
Patient taken to specials. Patient's family accompanied the patient.

## 2017-05-05 NOTE — ED Notes (Addendum)
Unable to find a pulse in the left foot. Left foot apears to be a purple white color. Pt right foot pulse was able to be heard used. LLE is feels cooler to touch than RLE

## 2017-05-05 NOTE — Progress Notes (Signed)
[place] for heparin drip Indication: arterial clot  Allergies  Allergen Reactions  . Promethazine Anaphylaxis  . Prempro [Conj Estrog-Medroxyprogest Ace] Other (See Comments)    Pin in my breast  . Evista [Raloxifene] Other (See Comments)    Breast pain  . Zoloft [Sertraline Hcl] Other (See Comments)    Pt does not remember what type of reaction.    Patient Measurements: Height: 5\' 2"  (157.5 cm) Weight: 215 lb (97.5 kg) IBW/kg (Calculated) : 50.1 Heparin Dosing Weight: 73.1 kg  Vital Signs: Temp: 98.7 F (37.1 C) (03/09 1112) Temp Source: Oral (03/09 1112) BP: 149/98 (03/09 1112) Pulse Rate: 69 (03/09 1112)  Labs: Recent Labs    05/05/17 1135  HGB 14.3  HCT 44.6  PLT 212  LABPROT 15.7*  INR 1.26  CREATININE 1.38*    Estimated Creatinine Clearance: 32.5 mL/min (A) (by C-G formula based on SCr of 1.38 mg/dL (H)).   Medical History: Past Medical History:  Diagnosis Date  . Arthritis   . Asthma   . Atrial fibrillation, unspecified   . Cancer (HCC)    rt side  . COPD (chronic obstructive pulmonary disease) ([place])   . Coronary artery disease   . Depression   . Dysrhythmia    A-fib  . Sleep apnea     Medications:  Patient takes warfarin 4mg  at bedtime daily. Her last dose was 3/8 @ 2000.   Assessment: 84 yo female presented to ED with complaints of swelling in left leg. Pharmacy was consulted to dose heparin for arterial clot.   Goal of Therapy:  Heparin level 0.3-0.7 units/ml Monitor platelets by anticoagulation protocol: Yes   Plan:  Give 4000 units bolus x 1 Start heparin infusion at 1200 units/hr Check anti-Xa level in 8 hours and daily while on heparin Continue to monitor H&H and platelets  Lendon Ka, PharmD Pharmacy Resident 05/05/2017,1:04 PM

## 2017-05-05 NOTE — Consult Note (Addendum)
[facility] Consult Note  MRN : [id number]  Madeline Cunningham is a 84 y.o. (21-Apr-1929) female who presents with chief complaint of  Chief Complaint  Patient presents with  . Hip Pain   History of Present Illness:  The patient is an 84 year old female with a past medical history of atrial fibrillation on chronic anticoagulation with Coumadin, COPD, hypertension, hyperlipidemia, coronary artery disease, depression, peripheral artery disease and asthma.  Patient is status post a left hip replacement approximately 2 years ago as per the patient's husband.  Patient was seen and examined with husband at bedside.  Most of the patient's history is obtained by the patient's husband.  Patient with possible dementia / memory issues?   The husband notes the patient began experiencing progressively worsening left lower extremity pain starting approximately around 5 AM this morning.  The patient's husband tried to massage the patient's left leg and apply warm compresses however her pain progressed to the point he became concerned and called EMS.  The patient does take Coumadin for atrial fibrillation however the patient's husband notes she had missed a "few doses".  The patient had run out of Coumadin and the husband was afraid to leave her in a "her condition" she is "forgetful" and she subsequently missed a few doses.  Patient's INR drawn by the emergency department 1.26  The patient does not ambulate much however denies any claudication-like symptoms, rest pain or ulceration to the lower extremity before this morning.  The patient denies any fever, nausea vomiting.  Upon arrival to the emergency department the patient was found to have a cold left lower extremity.  CTA of the lower extremity was notable for:  1. Age-indeterminate occlusion of the proximal aspect of the left superficial femoral artery. 2. Atretic reconstitution of the distal aspect the left superficial femoral  artery with atretic flow through the left popliteal and proximal aspects of the tibial arteries. 3. No arterial flow is seen below the level of the proximal calf however this is potentially secondary to out running the contrast bolus in the setting of the more proximal occlusion.  Vascular surgery was consulted by Dr. Archie Balboa for further recommendations regarding endovascular versus open intervention and attempt to revascularize left lower extremity.  Current Facility-Administered Medications  Medication Dose Route Frequency Provider Last Rate Last Dose  . 0.9 %  sodium chloride infusion   Intravenous Continuous Torrian Canion A, PA-C      . ceFAZolin (ANCEF) IVPB 2g/100 mL premix  2 g Intravenous Once Hughie Melroy A, PA-C      . famotidine (PEPCID) tablet 40 mg  40 mg Oral PRN Kindle Strohmeier A, PA-C      . heparin ADULT infusion 100 units/mL (25000 units/261mL sodium chloride 0.45%)  1,200 Units/hr Intravenous Continuous Lifsey, Betti Cruz, RPH      . heparin bolus via infusion 4,000 Units  4,000 Units Intravenous Once Anette Riedel, RPH      . methylPREDNISolone sodium succinate (SOLU-MEDROL) 125 mg/2 mL injection 125 mg  125 mg Intravenous PRN Royer Cristobal, Janalyn Harder, PA-C       Current Outpatient Medications  Medication Sig Dispense Refill  . acetaminophen (TYLENOL) 500 MG tablet Take 500 mg by mouth daily as needed for mild pain.    . busPIRone (BUSPAR) 10 MG tablet Take 10 mg by mouth 2 (two) times daily.    Marland Kitchen diltiazem (TIAZAC) 240 MG 24 hr capsule Take 120 mg by mouth daily with lunch.    Marland Kitchen  FLUoxetine (PROZAC) 20 MG capsule Take 20 mg by mouth every morning.    . furosemide (LASIX) 20 MG tablet Take 40 mg by mouth daily.    Marland Kitchen gabapentin (NEURONTIN) 100 MG capsule Take 200 mg by mouth 3 (three) times daily.    Marland Kitchen LORazepam (ATIVAN) 0.5 MG tablet Take 0.5 mg by mouth every 12 (twelve) hours as needed for anxiety.    . metoprolol (TOPROL-XL) 200 MG 24 hr tablet Take 200  mg by mouth at bedtime.    . nitroGLYCERIN (NITROSTAT) 0.4 MG SL tablet Place 0.4 mg under the tongue every 5 (five) minutes as needed for chest pain.    . pravastatin (PRAVACHOL) 80 MG tablet Take 80 mg by mouth at bedtime.    . ropinirole (REQUIP) 5 MG tablet Take 5 mg by mouth at bedtime.    Marland Kitchen warfarin (COUMADIN) 4 MG tablet Take 4 mg by mouth at bedtime.     Marland Kitchen HYDROcodone-acetaminophen (NORCO) 5-325 MG tablet Take 1 tablet by mouth every 6 (six) hours as needed for moderate pain. (Patient not taking: Reported on 05/05/2017) 20 tablet 0   Past Medical History:  Diagnosis Date  . Arthritis   . Asthma   . Atrial fibrillation, unspecified   . Cancer (HCC)    rt side  . COPD (chronic obstructive pulmonary disease) ([place])   . Coronary artery disease   . Depression   . Dysrhythmia    A-fib  . Sleep apnea    Past Surgical History:  Procedure Laterality Date  . APPENDECTOMY    . CARDIAC CATHETERIZATION    . CARPAL TUNNEL RELEASE Right 04/27/2015   Procedure: CARPAL TUNNEL RELEASE;  Surgeon: Hessie Knows, MD;  Location: [facility] ORS;  Service: Orthopedics;  Laterality: Right;  . CATARACT EXTRACTION W/ INTRAOCULAR LENS IMPLANT Right 03/25/2007  . CATARACT EXTRACTION W/ INTRAOCULAR LENS IMPLANT Left 2010  . FRACTURE SURGERY Left    ORIF wrist   Social History Social History   Tobacco Use  . Smoking status: Former Smoker    Last attempt to quit: 04/20/1975    Years since quitting: 42.0  . Smokeless tobacco: Never Used  Substance Use Topics  . Alcohol use: No  . Drug use: No   Family History History reviewed. No pertinent family history.  The patient denies any family history of peripheral artery, venous or renal disease.  Allergies  Allergen Reactions  . Promethazine Anaphylaxis  . Prempro [Conj Estrog-Medroxyprogest Ace] Other (See Comments)    Pin in my breast  . Evista [Raloxifene] Other (See Comments)    Breast pain  . Zoloft [Sertraline Hcl] Other (See Comments)    Pt does  not remember what type of reaction.   REVIEW OF SYSTEMS (Negative unless checked)  Constitutional: [] Weight loss  [] Fever  [] Chills Cardiac: [] Chest pain   [] Chest pressure   [] Palpitations   [] Shortness of breath when laying flat   [] Shortness of breath at rest   [] Shortness of breath with exertion. Vascular:  [x] Pain in legs with walking   [x] Pain in legs at rest   [x] Pain in legs when laying flat   [x] Claudication   [] Pain in feet when walking  [x] Pain in feet at rest  [x] Pain in feet when laying flat   [] History of DVT   [] Phlebitis   [x] Swelling in legs   [] Varicose veins   [] Non-healing ulcers Pulmonary:   [] Uses home oxygen   [] Productive cough   [] Hemoptysis   [] Wheeze  [] COPD   [] Asthma Neurologic:  []   Dizziness  [] Blackouts   [] Seizures   [] History of stroke   [] History of TIA  [] Aphasia   [] Temporary blindness   [] Dysphagia   [] Weakness or numbness in arms   [] Weakness or numbness in legs Musculoskeletal:  [] Arthritis   [] Joint swelling   [] Joint pain   [] Low back pain Hematologic:  [] Easy bruising  [] Easy bleeding   [] Hypercoagulable state   [] Anemic  [] Hepatitis Gastrointestinal:  [] Blood in stool   [] Vomiting blood  [] Gastroesophageal reflux/heartburn   [] Difficulty swallowing. Genitourinary:  [] Chronic kidney disease   [] Difficult urination  [] Frequent urination  [] Burning with urination   [] Blood in urine Skin:  [] Rashes   [] Ulcers   [] Wounds Psychological:  [] History of anxiety   []  History of major depression.  Physical Examination  Vitals:   05/05/17 1103 05/05/17 1112 05/05/17 1113  BP:  (!) 149/98   Pulse:  69   Resp:  20   Temp:  98.7 F (37.1 C)   TempSrc:  Oral   SpO2: 98% 98%   Weight:   215 lb (97.5 kg)  Height:   5\' 2"  (1.575 m)   Body mass index is 39.32 kg/m. Gen:  WD/WN, NAD Head: [place]/AT, No temporalis wasting. Prominent temp pulse not noted. Ear/Nose/Throat: Hearing grossly intact, nares w/o erythema or drainage, oropharynx w/o Erythema/Exudate Eyes:  Sclera non-icteric, conjunctiva clear Neck: Trachea midline.  No JVD.  Pulmonary:  Good air movement, respirations not labored, equal bilaterally.  Cardiac: Irregularly irregular. Vascular:  Vessel Right Left  Radial Palpable Palpable  Ulnar Palpable Palpable  Brachial Palpable Palpable  Carotid Palpable, without bruit Palpable, without bruit  Aorta Not palpable N/A  Femoral Palpable Palpable  Popliteal Palpable Palpable  PT Non-Palpable Non-Palpable  DP Non-Palpable Non-Palpable   Left lower extremity: Extremity is cold.  There are no palpable pedal pulses.  There is no mottling noted on exam.  Excoriation markings on the front of the calf.  Motor and sensory are intact.  Moderate 1+ pitting edema noted to the bilateral lower extremity  Gastrointestinal: soft, non-tender/non-distended. No guarding/reflex.  Musculoskeletal: M/S 5/5 throughout.  Neurologic: Sensation grossly intact in extremities.  Symmetrical.  Speech is fluent. Motor exam as listed above. Psychiatric: Judgment intact, Mood & affect appropriate for pt's clinical situation. Dermatologic: No rashes or ulcers noted.  No cellulitis or open wounds. Lymph : No Cervical, Axillary, or Inguinal lymphadenopathy.  CBC Lab Results  Component Value Date   WBC 6.7 05/05/2017   HGB 14.3 05/05/2017   HCT 44.6 05/05/2017   MCV 90.6 05/05/2017   PLT 212 05/05/2017   BMET    Component Value Date/Time   NA 139 05/05/2017 1135   NA 140 05/19/2013 0120   K 3.6 05/05/2017 1135   K 4.3 05/19/2013 0120   CL 104 05/05/2017 1135   CL 110 (H) 05/19/2013 0120   CO2 25 05/05/2017 1135   CO2 27 05/19/2013 0120   GLUCOSE 124 (H) 05/05/2017 1135   GLUCOSE 95 05/19/2013 0120   BUN 44 (H) 05/05/2017 1135   BUN 18 05/19/2013 0120   CREATININE 1.38 (H) 05/05/2017 1135   CREATININE 1.09 05/19/2013 0120   CALCIUM 9.1 05/05/2017 1135   CALCIUM 8.5 05/19/2013 0120   GFRNONAA 34 (L) 05/05/2017 1135   GFRNONAA 47 (L) 05/19/2013 0120    GFRAA 39 (L) 05/05/2017 1135   GFRAA 55 (L) 05/19/2013 0120   Estimated Creatinine Clearance: 32.5 mL/min (A) (by C-G formula based on SCr of 1.38 mg/dL (H)).  COAG Lab  Results  Component Value Date   INR 1.26 05/05/2017   INR 1.20 04/27/2016   INR 2.9 05/19/2013   Radiology Ct Angio Low Extrem Left W &/or Wo Contrast  Result Date: 05/05/2017 CLINICAL DATA:  Loss of feelings in legs.  Evaluate for PAD. EXAM: CT ANGIOGRAPHY OF ABDOMINAL AORTA WITH ILIOFEMORAL RUNOFF TECHNIQUE: Multidetector CT imaging of the abdomen, pelvis and lower extremities was performed using the standard protocol during bolus administration of intravenous contrast. Multiplanar CT image reconstructions and MIPs were obtained to evaluate the vascular anatomy. CONTRAST:  192mL ISOVUE-370 IOPAMIDOL (ISOVUE-370) INJECTION 76% COMPARISON:  None FINDINGS: VASCULAR Aorta: Moderate amount of predominantly calcified though slightly irregular atherosclerotic plaque scattered throughout a normal caliber abdominal aorta. No abdominal aortic dissection or periaortic stranding. Celiac: Minimal amount of atherosclerotic plaque involving the origin the celiac artery, not resulting in hemodynamically significant stenosis. Conventional branching pattern. SMA: Atherosclerotic plaque involving the origin and main trunk of the SMA (representative image 34, series 6), not resulting in hemodynamically significant stenosis. A replaced right hepatic artery arises from the proximal SMA. 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 Both renal arteries are diminutive though appear patent. Moderate to large amount of mixed calcified and noncalcified atherosclerotic plaque involving the origin of the left renal artery approaches approximately 50% luminal narrowing (image 35, series 6, coronal images 70 and 71, series 7). IMA: Remains patent. RIGHT Lower Extremity Inflow: Noncalcified atherosclerotic plaque within the right common iliac artery, not  resulting in hemodynamically significant stenosis. The right internal iliac artery is disease though patent and of normal caliber. The right external iliac artery is widely patent without hemodynamically significant stenosis. Outflow: Mixed calcified and noncalcified atherosclerotic plaque within the right common femoral artery, not resulting in hemodynamically significant stenosis. The right deep femoral artery is widely patent throughout its imaged course. Minimal amount of calcified atherosclerotic plaque involving the proximal aspect of the right superficial femoral artery (image 112, series 6), not resulting in hemodynamically significant stenosis. Atherosclerotic plaque within the right above and below-knee popliteal artery, not resulting in hemodynamically significant stenosis. Runoff: Three-vessel runoff to the right lower leg and foot. The right-sided dorsalis pedis artery is patent to the level of the midfoot. LEFT Lower Extremity Inflow: Atherosclerotic plaque within the left common iliac artery, not resulting in a hemodynamically significant stenosis. The left internal iliac artery is diseased though patent and of normal caliber. The left external iliac artery is widely patent. Outflow: The left common femoral artery is obscured secondary streak artifact from the patient's left total hip prosthesis. The left deep femoral artery appears patent throughout its imaged course. There is age-indeterminate occlusion of the left superficial femoral artery at the level of the proximal thigh (image 151, series 6) with reconstitution at the level of the abductor canal via deep thigh collaterals (image 193, series 6). Atretic flow was seen throughout the remainder of the distal aspect of the superficial femoral artery extending to both the left above and below-knee popliteal arteries. Runoff: Atretic flow was seen within the proximal aspects of all 3 tibial arteries, however all vessels are non-opacified at the level  of the proximal calf, potentially secondary to out running the contrast bolus due to proximal occlusion. Veins: Pelvic venous system and IVC appear widely patent on this arterial phase examination. Note made of a tiny circumaortic left renal vein. Review of the MIP images confirms the above findings. NON-VASCULAR Evaluation the abdominal organs is limited to the arterial phase of enhancement. Lower chest: Limited visualization of  the lower thorax demonstrates approximately 1.4 x 0.7 cm nodular opacity with the peripheral aspect of the imaged left lower lobe (image 3, series 6). Extensive interstitial thickening within the imaged lung bases. No focal airspace opacities. No pleural effusion. Suspected cardiomegaly. There is reflux of contrast into the intrahepatic venous system. Hepatobiliary: Normal hepatic contour. There is reflux of injected contrast into the hepatic venous system. No discrete hepatic lesions. Normal appearance of the gallbladder given degree of distention. No radiopaque gallstones. No intra extrahepatic biliary ductal dilatation. No ascites. Pancreas: Normal appearance of the pancreas Spleen: Normal appearance of the spleen Adrenals/Urinary Tract: There is symmetric enhancement of the bilateral kidneys. Note is made of extra renal pelves bilaterally. Note is made of a approximately 0.8 cm hypoattenuating partially exophytic left-sided renal lesion, too small to accurately characterize. No discrete right-sided renal lesions. No definite renal stones this postcontrast examination. No urine obstruction or perinephric stranding. Normal appearance of the bilateral adrenal glands. Normal appearance of the urinary bladder. Stomach/Bowel: Large colonic stool burden without evidence of enteric obstruction. Normal appearance of the terminal ileum. The appendix is not visualized, however there is no pericecal inflammatory change. No pneumoperitoneum, pneumatosis or portal venous gas. Lymphatic: Scattered  retroperitoneal lymph nodes are numerous though individually not enlarged by size criteria with index aortocaval lymph node measuring 0.9 cm in greatest short axis diameter (image 46, series 6). No definitive bulky retroperitoneal, mesenteric, pelvic or inguinal lymphadenopathy. Reproductive: Note is made of a peripherally calcified (approximately 1 cm) left-sided adnexal structure. Otherwise, normal appearance of the pelvic organs for age. No free fluid in the pelvic cul-de-sac. Other: Diffuse body wall anasarca with subcutaneous edema seen about the midline of the low back. Bilateral lower extremity subcutaneous edema, left greater than right. Musculoskeletal: Grade 1 anterolisthesis of L4 upon L5 measuring approximately 1 cm with nondisplaced age-indeterminate left-sided L5 pars defect (sagittal image 121, series 8). Age-indeterminate though presumably chronic moderate (approximately 40%) compression deformity involving the superior endplate of D63. Moderate severe multilevel lumbar spine DDD, worse at L4-L5 and L5-S1. Post left bipolar hip replacement without definite evidence of hardware failure or loosening. Old right superior and inferior pubic rami fractures. IMPRESSION: VASCULAR 1. Atherosclerotic plaque within a normal caliber abdominal aorta. 2. Mixed calcified and noncalcified atherosclerotic plaque approaches 50% luminal narrowing involving the origin of the left renal artery without evidence of asymmetric left-sided renal atrophy. 3.  Aortic Atherosclerosis (ICD10-I70.0). Left lower extremity vascular Impression: 1. Age-indeterminate occlusion of the proximal aspect of the left superficial femoral artery. 2. Atretic reconstitution of the distal aspect the left superficial femoral artery with atretic flow through the left popliteal and proximal aspects of the tibial arteries. 3. No arterial flow is seen below the level of the proximal calf however this is potentially secondary to out running the  contrast bolus in the setting of the more proximal occlusion. Right lower extremity vascular Impression: 1. No evidence of a hemodynamically significant stenosis affecting the right lower extremity. Three-vessel runoff to the right lower leg and foot. Right-sided dorsalis pedis artery is patent to the level the midfoot. _________________________________________________________ NON-VASCULAR 1. Findings suggestive of pulmonary edema with diffuse body wall anasarca (most conspicuous about the midline of the low back) and reflux of contrast into the intrahepatic venous system, nonspecific though could be seen in the setting of right-sided heart failure. Further evaluation with cardiac echo could be performed as indicated. 2. 1.4 cm subpleural nodular opacity within the imaged left lower lobe. Further evaluation with dedicated complete  chest CT could be performed after the resolution of the patient's acute symptoms. 3. Grade 1 anterolisthesis of L4 upon L5 with associated age-indeterminate nondisplaced left-sided L5 pars defect. 4. Moderate to severe multilevel lumbar spine DDD, worse at L4-L5 and L5-S1. Electronically Signed   By: Sandi Mariscal M.D.   On: 05/05/2017 12:49   Assessment/Plan The patient is an 84 year old female with multiple medical issues including A. fib on chronic anticoagulation with Coumadin and peripheral artery disease who presented to the emergency room with progressively worsening left lower extremity pain since approximately 5:00am this morning.   1.  Left lower extremity ischemia: CTA of the bilateral lower extremities with distal runoff notable for: Age-indeterminate occlusion of the proximal aspect of the left superficial femoral artery. Atretic reconstitution of the distal aspect the left superficial femoral artery with atretic flow through the left popliteal and proximal aspects of the tibial arteries. No arterial flow is seen below the level of the proximal calf however this is  potentially secondary to out running the contrast bolus in the setting of the more proximal occlusion.  The extremity is cold on examination pedal and popliteal pulses are not palpable.  This presents a limb threatening situation.  Recommend a left lower extremity angiogram with possible intervention to assess the patient's anatomy and if appropriate an attempt at revascularization endovascularly of the extremity can take place.  The patient and her husband understand that if we do not intervene there is a strong possibility of tissue loss.  Procedure, risks and benefits explained to the patient.  All questions answered.  The patient and her husband wish to proceed.  The patient was made n.p.o. and a heparin drip was started in the emergency department. 2.  Hypertension: Encouraged good control as its slows the progression of atherosclerotic disease 3.  Hyperlipidemia: Encouraged good control as its slows the progression of atherosclerotic disease  Discussed with Dr. Francene Castle, PA-C  05/05/2017 1:21 PM    This note was created with Dragon medical transcription system.  Any error is purely unintentional

## 2017-05-05 NOTE — Consult Note (Signed)
[facility] Consultation   ASSESSMENT/PLAN   Acute arterial occlusion. Status post successful embolectomy per vascular surgery. We'll monitor in intensive care unit, close follow-up of lower extremity pulses, anticoagulation per vascular surgery.  Atrial fibrillation. Controlled ventricular response  Renal insufficiency. Will follow closely  History of COPD/asthma. We'll place on as needed inhaler therapy  Name: Madeline Cunningham MRN: [id number] DOB: [birth year]    ADMISSION DATE:  05/05/2017 CONSULTATION DATE:  05/05/2017  REFERRING MD :  Dr. Delana Meyer  CHIEF COMPLAINT / HPI: Madeline Cunningham is a very pleasant 84 year old Caucasian female with a past medical history remarkable for atrial fibrillation on chronic anticoagulation therapy, COPD, hypertension, hyperlipidemia, coronary artery disease, depression, peripheral arterial disease, developed acute onset of left lower extremity pain at 5 AM in the morning. Patient was brought into the emergency department, found to have a subtherapeutic INR at 1.26 CT angiogram revealed vascular occlusion. Was taken to vascular surgery, now status post thrombectomy and moved to the intensive care unit for postoperative management. Presently she is awake, alert, in no acute distress, states her left lower extremity is feeling much better.   PAST MEDICAL HISTORY :  Past Medical History:  Diagnosis Date  . Arthritis   . Asthma   . Atrial fibrillation, unspecified   . Cancer (HCC)    rt side  . COPD (chronic obstructive pulmonary disease) ([place])   . Coronary artery disease   . Depression   . Dysrhythmia    A-fib  . Sleep apnea    Past Surgical History:  Procedure Laterality Date  . APPENDECTOMY    . CARDIAC CATHETERIZATION    . CARPAL TUNNEL RELEASE Right 04/27/2015   Procedure: CARPAL TUNNEL RELEASE;  Surgeon: Hessie Knows, MD;  Location: [facility] ORS;  Service: Orthopedics;  Laterality: Right;  . CATARACT EXTRACTION W/ INTRAOCULAR  LENS IMPLANT Right 03/25/2007  . CATARACT EXTRACTION W/ INTRAOCULAR LENS IMPLANT Left 2010  . FRACTURE SURGERY Left    ORIF wrist   Prior to Admission medications   Medication Sig Start Date End Date Taking? Authorizing Provider  acetaminophen (TYLENOL) 500 MG tablet Take 500 mg by mouth daily as needed for mild pain.   Yes [provider]  busPIRone (BUSPAR) 10 MG tablet Take 10 mg by mouth 2 (two) times daily.   Yes [provider]  diltiazem (TIAZAC) 240 MG 24 hr capsule Take 120 mg by mouth daily with lunch.   Yes [provider]  FLUoxetine (PROZAC) 20 MG capsule Take 20 mg by mouth every morning.   Yes [provider]  furosemide (LASIX) 20 MG tablet Take 40 mg by mouth daily.   Yes [provider]  gabapentin (NEURONTIN) 100 MG capsule Take 200 mg by mouth 3 (three) times daily.   Yes [provider]  LORazepam (ATIVAN) 0.5 MG tablet Take 0.5 mg by mouth every 12 (twelve) hours as needed for anxiety.   Yes [provider]  metoprolol (TOPROL-XL) 200 MG 24 hr tablet Take 200 mg by mouth at bedtime.   Yes [provider]  nitroGLYCERIN (NITROSTAT) 0.4 MG SL tablet Place 0.4 mg under the tongue every 5 (five) minutes as needed for chest pain.   Yes [provider]  pravastatin (PRAVACHOL) 80 MG tablet Take 80 mg by mouth at bedtime.   Yes [provider]  ropinirole (REQUIP) 5 MG tablet Take 5 mg by mouth at bedtime.   Yes [provider]  warfarin (COUMADIN) 4 MG tablet  Take 4 mg by mouth at bedtime.    Yes [provider]  HYDROcodone-acetaminophen (NORCO) 5-325 MG tablet Take 1 tablet by mouth every 6 (six) hours as needed for moderate pain. Patient not taking: Reported on 05/05/2017 04/27/15   Hessie Knows, MD   Allergies  Allergen Reactions  . Promethazine Anaphylaxis  . Prempro [Conj Estrog-Medroxyprogest Ace] Other (See Comments)    Pin in my breast  . Evista [Raloxifene]  Other (See Comments)    Breast pain  . Zoloft [Sertraline Hcl] Other (See Comments)    Pt does not remember what type of reaction.    FAMILY HISTORY:  History reviewed. No pertinent family history. SOCIAL HISTORY:  reports that she quit smoking about 42 years ago. she has never used smokeless tobacco. She reports that she does not drink alcohol or use drugs.  REVIEW OF SYSTEMS:     The remainder of systems were reviewed and were found to be negative other than what is documented in the HPI.    VITAL SIGNS: Temp:  [97.7 F (36.5 C)-98.7 F (37.1 C)] 97.7 F (36.5 C) (03/09 1631) Pulse Rate:  [63-94] 64 (03/09 1700) Resp:  [15-20] 15 (03/09 1700) BP: (114-185)/(77-99) 148/78 (03/09 1700) SpO2:  [92 %-100 %] 96 % (03/09 1700) Weight:  [97.5 kg (215 lb)-100.1 kg (220 lb 10.9 oz)] 100.1 kg (220 lb 10.9 oz) (03/09 1631) HEMODYNAMICS:   INTAKE / OUTPUT:  Intake/Output Summary (Last 24 hours) at 05/05/2017 1800 Last data filed at 05/05/2017 1630 Gross per 24 hour  Intake 50 ml  Output -  Net 50 ml    Physical Examination:   VS: BP (!) 148/78   Pulse 64   Temp 97.7 F (36.5 C) (Oral)   Resp 15   Ht 5\' 5"  (1.651 m)   Wt 100.1 kg (220 lb 10.9 oz)   SpO2 96%   BMI 36.72 kg/m   General Appearance: No distress, pleasant elderly appearing female who is awake alert with some level of confusion Neuro:without focal findings, speech normal,. HEENT: PERRLA, EOM intact, no ptosis, no other lesions noticed;  Pulmonary: normal breath sounds., diaphragmatic excursion normal. Cardiovascular irregularly irregular rhythm with controlled ventricular response Abdomen: Benign, Soft, non-tender, No masses, hepatosplenomegaly, No lymphadenopathy Skin:   warm, no rashes, no ecchymosis  Extremities: Lower extremities are swollen bilaterally, left foot is still cool compared to the right, dopplerable pulses per vascular surgery/nursing notes.    LABS: Reviewed   LABORATORY PANEL:    CBC Recent Labs  Lab 05/05/17 1135  WBC 6.7  HGB 14.3  HCT 44.6  PLT 212    Chemistries  Recent Labs  Lab 05/05/17 1135  NA 139  K 3.6  CL 104  CO2 25  GLUCOSE 124*  BUN 44*  CREATININE 1.38*  CALCIUM 9.1    Recent Labs  Lab 05/05/17 1630  GLUCAP 119*   No results for input(s): PHART, PCO2ART, PO2ART in the last 168 hours. No results for input(s): AST, ALT, ALKPHOS, BILITOT, ALBUMIN in the last 168 hours.  Cardiac Enzymes No results for input(s): TROPONINI in the last 168 hours.  RADIOLOGY:  Ct Angio Low Extrem Left W &/or Wo Contrast  Result Date: 05/05/2017 CLINICAL DATA:  Loss of feelings in legs.  Evaluate for PAD. EXAM: CT ANGIOGRAPHY OF ABDOMINAL AORTA WITH ILIOFEMORAL RUNOFF TECHNIQUE: Multidetector CT imaging of the abdomen, pelvis and lower extremities was performed using the standard protocol during bolus administration of intravenous contrast. Multiplanar CT image reconstructions and  MIPs were obtained to evaluate the vascular anatomy. CONTRAST:  14mL ISOVUE-370 IOPAMIDOL (ISOVUE-370) INJECTION 76% COMPARISON:  None FINDINGS: VASCULAR Aorta: Moderate amount of predominantly calcified though slightly irregular atherosclerotic plaque scattered throughout a normal caliber abdominal aorta. No abdominal aortic dissection or periaortic stranding. Celiac: Minimal amount of atherosclerotic plaque involving the origin the celiac artery, not resulting in hemodynamically significant stenosis. Conventional branching pattern. SMA: Atherosclerotic plaque involving the origin and main trunk of the SMA (representative image 34, series 6), not resulting in hemodynamically significant stenosis. A replaced right hepatic artery arises from the proximal SMA. 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 Both renal arteries are diminutive though appear patent. Moderate to large amount of mixed calcified and noncalcified atherosclerotic plaque involving the origin of the left renal  artery approaches approximately 50% luminal narrowing (image 35, series 6, coronal images 70 and 71, series 7). IMA: Remains patent. RIGHT Lower Extremity Inflow: Noncalcified atherosclerotic plaque within the right common iliac artery, not resulting in hemodynamically significant stenosis. The right internal iliac artery is disease though patent and of normal caliber. The right external iliac artery is widely patent without hemodynamically significant stenosis. Outflow: Mixed calcified and noncalcified atherosclerotic plaque within the right common femoral artery, not resulting in hemodynamically significant stenosis. The right deep femoral artery is widely patent throughout its imaged course. Minimal amount of calcified atherosclerotic plaque involving the proximal aspect of the right superficial femoral artery (image 112, series 6), not resulting in hemodynamically significant stenosis. Atherosclerotic plaque within the right above and below-knee popliteal artery, not resulting in hemodynamically significant stenosis. Runoff: Three-vessel runoff to the right lower leg and foot. The right-sided dorsalis pedis artery is patent to the level of the midfoot. LEFT Lower Extremity Inflow: Atherosclerotic plaque within the left common iliac artery, not resulting in a hemodynamically significant stenosis. The left internal iliac artery is diseased though patent and of normal caliber. The left external iliac artery is widely patent. Outflow: The left common femoral artery is obscured secondary streak artifact from the patient's left total hip prosthesis. The left deep femoral artery appears patent throughout its imaged course. There is age-indeterminate occlusion of the left superficial femoral artery at the level of the proximal thigh (image 151, series 6) with reconstitution at the level of the abductor canal via deep thigh collaterals (image 193, series 6). Atretic flow was seen throughout the remainder of the distal  aspect of the superficial femoral artery extending to both the left above and below-knee popliteal arteries. Runoff: Atretic flow was seen within the proximal aspects of all 3 tibial arteries, however all vessels are non-opacified at the level of the proximal calf, potentially secondary to out running the contrast bolus due to proximal occlusion. Veins: Pelvic venous system and IVC appear widely patent on this arterial phase examination. Note made of a tiny circumaortic left renal vein. Review of the MIP images confirms the above findings. NON-VASCULAR Evaluation the abdominal organs is limited to the arterial phase of enhancement. Lower chest: Limited visualization of the lower thorax demonstrates approximately 1.4 x 0.7 cm nodular opacity with the peripheral aspect of the imaged left lower lobe (image 3, series 6). Extensive interstitial thickening within the imaged lung bases. No focal airspace opacities. No pleural effusion. Suspected cardiomegaly. There is reflux of contrast into the intrahepatic venous system. Hepatobiliary: Normal hepatic contour. There is reflux of injected contrast into the hepatic venous system. No discrete hepatic lesions. Normal appearance of the gallbladder given degree of distention. No radiopaque gallstones. No intra  extrahepatic biliary ductal dilatation. No ascites. Pancreas: Normal appearance of the pancreas Spleen: Normal appearance of the spleen Adrenals/Urinary Tract: There is symmetric enhancement of the bilateral kidneys. Note is made of extra renal pelves bilaterally. Note is made of a approximately 0.8 cm hypoattenuating partially exophytic left-sided renal lesion, too small to accurately characterize. No discrete right-sided renal lesions. No definite renal stones this postcontrast examination. No urine obstruction or perinephric stranding. Normal appearance of the bilateral adrenal glands. Normal appearance of the urinary bladder. Stomach/Bowel: Large colonic stool burden  without evidence of enteric obstruction. Normal appearance of the terminal ileum. The appendix is not visualized, however there is no pericecal inflammatory change. No pneumoperitoneum, pneumatosis or portal venous gas. Lymphatic: Scattered retroperitoneal lymph nodes are numerous though individually not enlarged by size criteria with index aortocaval lymph node measuring 0.9 cm in greatest short axis diameter (image 46, series 6). No definitive bulky retroperitoneal, mesenteric, pelvic or inguinal lymphadenopathy. Reproductive: Note is made of a peripherally calcified (approximately 1 cm) left-sided adnexal structure. Otherwise, normal appearance of the pelvic organs for age. No free fluid in the pelvic cul-de-sac. Other: Diffuse body wall anasarca with subcutaneous edema seen about the midline of the low back. Bilateral lower extremity subcutaneous edema, left greater than right. Musculoskeletal: Grade 1 anterolisthesis of L4 upon L5 measuring approximately 1 cm with nondisplaced age-indeterminate left-sided L5 pars defect (sagittal image 121, series 8). Age-indeterminate though presumably chronic moderate (approximately 40%) compression deformity involving the superior endplate of K27. Moderate severe multilevel lumbar spine DDD, worse at L4-L5 and L5-S1. Post left bipolar hip replacement without definite evidence of hardware failure or loosening. Old right superior and inferior pubic rami fractures. IMPRESSION: VASCULAR 1. Atherosclerotic plaque within a normal caliber abdominal aorta. 2. Mixed calcified and noncalcified atherosclerotic plaque approaches 50% luminal narrowing involving the origin of the left renal artery without evidence of asymmetric left-sided renal atrophy. 3.  Aortic Atherosclerosis (ICD10-I70.0). Left lower extremity vascular Impression: 1. Age-indeterminate occlusion of the proximal aspect of the left superficial femoral artery. 2. Atretic reconstitution of the distal aspect the left  superficial femoral artery with atretic flow through the left popliteal and proximal aspects of the tibial arteries. 3. No arterial flow is seen below the level of the proximal calf however this is potentially secondary to out running the contrast bolus in the setting of the more proximal occlusion. Right lower extremity vascular Impression: 1. No evidence of a hemodynamically significant stenosis affecting the right lower extremity. Three-vessel runoff to the right lower leg and foot. Right-sided dorsalis pedis artery is patent to the level the midfoot. _________________________________________________________ NON-VASCULAR 1. Findings suggestive of pulmonary edema with diffuse body wall anasarca (most conspicuous about the midline of the low back) and reflux of contrast into the intrahepatic venous system, nonspecific though could be seen in the setting of right-sided heart failure. Further evaluation with cardiac echo could be performed as indicated. 2. 1.4 cm subpleural nodular opacity within the imaged left lower lobe. Further evaluation with dedicated complete chest CT could be performed after the resolution of the patient's acute symptoms. 3. Grade 1 anterolisthesis of L4 upon L5 with associated age-indeterminate nondisplaced left-sided L5 pars defect. 4. Moderate to severe multilevel lumbar spine DDD, worse at L4-L5 and L5-S1. Electronically Signed   By: Sandi Mariscal M.D.   On: 05/05/2017 12:49     Burna Atlas, DO 05/05/2017, 6:00 PM

## 2017-05-05 NOTE — ED Notes (Signed)
Report given to RN in [place].

## 2017-05-05 NOTE — ED Triage Notes (Signed)
Pt arrives via ems from home with report of hip and leg pain. EMS report that pt has a hx of left hip fx 2 years ago, pt has had a resent increase in swelling of the legs, pt and husband unable to state when swelling first increased. Pain in hips started abruptly this morning. Pt denies any recent falls or trauma.

## 2017-05-05 NOTE — Op Note (Signed)
OPERATIVE NOTE   PROCEDURE: 1. Left lower extremity angiography third order catheter placement with additional third order catheter placement 2. Ultrasound-guided access right common femoral artery for sheath placement 3. Infusion 6 mg of TPA left common femoral 4. Mechanical thrombectomy of the left common femoral artery with the penumbra cat 6 5. Mechanical thrombectomy of the left posterior tibial artery with the penumbra cat 3 6. Mechanical thrombectomy of the left anterior tibial artery with the penumbra cat 3 7. Star close right common femoral artery  PRE-OPERATIVE DIAGNOSIS: Acute ischemia left lower extremities secondary to embolization; arterial embolization secondary to atrial fibrillation; atrial fibrillation without anticoagulation  POST-OPERATIVE DIAGNOSIS: Same  SURGEON: Katha Cabal, M.D.  ANESTHESIA: Conscious sedation was administered under my direct supervision by the interventional radiology RN. IV Versed plus fentanyl were utilized. Continuous ECG, pulse oximetry and blood pressure was monitored throughout the entire procedure.  Conscious sedation was for a total of 127 minutes.  ESTIMATED BLOOD LOSS: Minimal cc  FINDING(S): 1.  Initial images are consistent with a large embolization to the left common femoral.  Further imaging also demonstrated embolic material within the tibial vessels  SPECIMEN(S):  None  INDICATIONS:   Madeline Cunningham is a 84 y.o. y.o. female who presents with signs of acute ischemia of the left lower extremity.  Patient has a history of atrial fibrillation.  Unfortunately she had not been taking her Coumadin for the last several days.  DESCRIPTION: After obtaining full informed written consent, the patient was brought back to the operating room and placed supine upon the operating table.  The patient received IV antibiotics prior to induction.  After obtaining adequate sedation, the patient was prepped and draped in the standard fashion  and appropriate time out is called.    Ultrasound is placed in a sterile sleeve ultrasound as utilized secondary to lack of appropriate landmarks and to avoid vascular injury. Under real-time visualization the right common femoral artery is identified is echolucent and pulsatile indicating patency. Image is recorded for the permanent record. 1% lidocaine is then infiltrated into the soft tissues with ultrasound visualization. Microneedle is then inserted into the anterior wall of the common femoral artery under direct visualization with ultrasound. Microwire followed by micro-sheath.   J-wire followed by 6 French sheath is then inserted without difficulty.  Imaging from the CT angiogram demonstrated the aorta appeared to be widely patent and free of significant disease.  However left lower extremity images were inadequate.  Therefore, J-wire is then advanced with pigtail catheter up to the level of L2 and RAO projection of the aorta is obtained. Pigtail catheter was repositioned to above the bifurcation.  Using a Stiff angled Glide Wire wire and the pigtail catheter the aortic bifurcation was crossed and the catheter is advanced down to the external iliac artery. Oblique view of the femoral bifurcation is then obtained by hand injection. The wire is then reintroduced and the catheter is positioned within the SFA. AP projections of the right lower extremity are obtained for distal runoff.  After review the images distal images are not adequate and the wire is reintroduced a 7 [place] Ansell sheath is advanced up and over the bifurcation and positioned with its tip in the distal external iliac artery and later in the case a 125 cm straight catheter is advanced down to the distal popliteal. Further imaging of the tibial vessels is then obtained.  An additional 1000 units of heparin is given as the patient was recently bolused and  placed on a heparin drip.  6 mg of TPA is then reconstituted in 10 cc.  A 10 cm  length infusion wire is then advanced across the common femoral and positioned with the tip several centimeters into the SFA.  The TPA was then administered over 30 seconds and allowed to dwell for an additional 20-30 minutes.  Follow-up imaging demonstrated modest improvement with better flow in the SFA but the bulk of the common femoral embolism remained.  The penumbra cat 6 is then advanced through the 7 [place] Ansell sheath and multiple passes are made in the common femoral and proximal SFA.  Full flow is noted in the penumbra and the penumbra catheter was removed over a wire.  Hand-injection contrast now demonstrates total resolution of the embolic material in the common femoral proximal superficial femoral as well as in the profunda femoris.  Kumpe catheter was then introduced over the wire and advanced down to the distal popliteal at the level of the femoral condyles and hand-injection contrast demonstrates there is embolic material within the posterior tibial tibioperoneal trunk peroneal and anterior tibial.  Initially the cat 6 is advanced down and the tibioperoneal trunk is treated.  Subsequently a 0.014 advantage wire is negotiated first into the posterior tibial and then a 3 catheter is advanced into the posterior tibial multiple passes are made.  Hand-injection of contrast demonstrates the posterior tibial is now patent with good flow.  Attention was then turned to the anterior tibial wire is negotiated into the anterior tibial and again the cat 3 penumbra catheter is used to make multiple passes.  Follow-up imaging demonstrates there is improved flow although some residual thrombus is now noted.  Also of note having cleared the posterior tibial and tibioperoneal trunk there is now flow in the peroneal as well.  Imaging demonstrates two-vessel runoff down to the foot.  After review of the images the catheter is removed sheath is pulled back into the left iliac system J-wire is then advanced and  socially and LAO projection of the groin is obtained after review of this image a Star Cose device is deployed without difficulty.  INTERPRETATION: The abdominal aorta is opacified with a bolus injection of contrast. There is diffuse atherosclerotic changes clearly visible even under fluoroscopy without contrast enhancement. However, there are no hemodynamically significant lesions noted within the aorta, bilateral common iliac arteries and bilateral external iliac arteries.  The left common femoral demonstrates occlusive material consistent with embolization that extends into the proximal SFA and the proximal profunda femoris arteries.  Initial images demonstrate the left superficial femoral artery is patent as is the popliteal.  Later in the case embolic material is noted in the trifurcation as described above.   Following TPA administration and then embolectomy using the cat 6 device there is now resolution of thrombus within the common femoral SFA and profunda femoris.  Following embolectomy within the trifurcation and tibial vessels as described above there is resolution of thrombus within the posterior tibial and peroneal and there is modest thrombus remaining in the anterior tibial but it is patent.   Summary: Successful embolectomy left lower extremity  COMPLICATIONS: There were no immediate consultations.  CONDITION: Margaretmary Dys, M.D. [facility] Office: [phone]   05/05/2017,4:21 PM

## 2017-05-05 NOTE — ED Provider Notes (Addendum)
[facility] Emergency Department Provider Note  ____________________________________________   I have reviewed the triage vital signs and the nursing notes.   HISTORY  Chief Complaint Hip Pain   History limited by: Not limited   HPI Madeline Cunningham is a 84 y.o. female who presents to the emergency department today via EMS because of concern for left leg pain. Apparently the symptoms started 30 minutes prior to EMS arrival. The patient does have history of some chronic left hip discomfort. Does have history of replacement to the left hip. In addition the patient has been having swelling to both legs for quite some time. No recent trauma to the leg.    Per medical record review patient has a history of atrial fibrillation, COPD, CAD.  Past Medical History:  Diagnosis Date  . Arthritis   . Asthma   . Atrial fibrillation, unspecified   . Cancer (HCC)    rt side  . COPD (chronic obstructive pulmonary disease) ([place])   . Coronary artery disease   . Depression   . Dysrhythmia    A-fib  . Sleep apnea     There are no active problems to display for this patient.   Past Surgical History:  Procedure Laterality Date  . APPENDECTOMY    . CARDIAC CATHETERIZATION    . CARPAL TUNNEL RELEASE Right 04/27/2015   Procedure: CARPAL TUNNEL RELEASE;  Surgeon: Hessie Knows, MD;  Location: [facility] ORS;  Service: Orthopedics;  Laterality: Right;  . CATARACT EXTRACTION W/ INTRAOCULAR LENS IMPLANT Right 03/25/2007  . CATARACT EXTRACTION W/ INTRAOCULAR LENS IMPLANT Left 2010  . FRACTURE SURGERY Left    ORIF wrist    Prior to Admission medications   Medication Sig Start Date End Date Taking? Authorizing Provider  Calcium Citrate-Vitamin D (CALCIUM + D PO) Take 1 tablet by mouth every other day. Calcium 600  Mg + vitamin D3 800 IU    [provider]  diltiazem (TIAZAC) 120 MG 24 hr capsule Take 120 mg by mouth daily with lunch.    [provider]  FLUoxetine  (PROZAC) 20 MG capsule Take 20 mg by mouth every morning.    [provider]  gabapentin (NEURONTIN) 100 MG capsule Take 200 mg by mouth 3 (three) times daily.    [provider]  HYDROcodone-acetaminophen (NORCO) 5-325 MG tablet Take 1 tablet by mouth every 6 (six) hours as needed for moderate pain. 04/27/15   Hessie Knows, MD  LORazepam (ATIVAN) 0.5 MG tablet Take 0.5 mg by mouth every 12 (twelve) hours as needed for anxiety.    [provider]  methadone (DOLOPHINE) 5 MG tablet Take 5 mg by mouth at bedtime.    [provider]  metoprolol (TOPROL-XL) 200 MG 24 hr tablet Take 200 mg by mouth at bedtime.    [provider]  nitroGLYCERIN (NITROSTAT) 0.4 MG SL tablet Place 0.4 mg under the tongue every 5 (five) minutes as needed for chest pain.    [provider]  pravastatin (PRAVACHOL) 80 MG tablet Take 80 mg by mouth at bedtime.    [provider]  ropinirole (REQUIP) 5 MG tablet Take 5 mg by mouth at bedtime.    [provider]  warfarin (COUMADIN) 1 MG tablet Take 1 mg by mouth at bedtime. Takes 0.5 tablet along with 5 mg tablet of Warfarin.    [provider]  warfarin (COUMADIN) 5 MG tablet Take 5 mg by mouth at bedtime.    [provider]    Allergies Promethazine; Prempro [conj estrog-medroxyprogest ace]; Evista [raloxifene]; and Zoloft [sertraline hcl]  History reviewed. No pertinent family history.  Social History Social History   Tobacco Use  . Smoking status: Former Smoker    Last attempt to quit: 04/20/1975    Years since quitting: 42.0  . Smokeless tobacco: Never Used  Substance Use Topics  . Alcohol use: No  . Drug use: No    Review of Systems Constitutional: No fever/chills Eyes: No visual changes. ENT: No sore throat. Cardiovascular: Denies chest pain. Respiratory: Denies shortness of breath. Gastrointestinal: No abdominal pain.  No nausea, no vomiting.  No diarrhea.    Genitourinary: Negative for dysuria. Musculoskeletal: Positive for left leg pain. Skin: Negative for rash. Neurological: Negative for headaches, focal weakness or numbness.  ____________________________________________   PHYSICAL EXAM:  VITAL SIGNS: ED Triage Vitals  Enc Vitals Group     BP 05/05/17 1112 (!) 149/98     Pulse Rate 05/05/17 1112 69     Resp 05/05/17 1112 20     Temp 05/05/17 1112 98.7 F (37.1 C)     Temp Source 05/05/17 1112 Oral     SpO2 05/05/17 1103 98 %     Weight 05/05/17 1113 215 lb (97.5 kg)     Height 05/05/17 1113 5\' 2"  (1.575 m)   Constitutional: Alert and oriented. Well appearing and in no distress. Eyes: Conjunctivae are normal.  ENT   Head: Normocephalic and atraumatic.   Nose: No congestion/rhinnorhea.   Mouth/Throat: Mucous membranes are moist.   Neck: No stridor. Hematological/Lymphatic/Immunilogical: No cervical lymphadenopathy. Cardiovascular: Normal rate, regular rhythm.  No murmurs, rubs, or gallops.  Respiratory: Normal respiratory effort without tachypnea nor retractions. Breath sounds are clear and equal bilaterally. No wheezes/rales/rhonchi. Gastrointestinal: Soft and non tender. No rebound. No guarding.  Genitourinary: Deferred Musculoskeletal: Bilateral lower extremity swelling. Left leg is somewhat cool compared to the right leg. Slightly mottled with decreased cap refill. Unable to palpate pulses bilaterally. Unable to doppler pulse on left leg. Neurologic:  Normal speech and language. No gross focal neurologic deficits are appreciated.  Skin:  Skin is warm, dry and intact. No rash noted. Psychiatric: Mood and affect are normal. Speech and behavior are normal. Patient exhibits appropriate insight and judgment.  ____________________________________________    LABS (pertinent positives/negatives)  BMP na 139, k 3.6, glu 124, cr 1.38 CBC wbc 6.7, hgb 14.3, plt  212  ____________________________________________   EKG  None  ____________________________________________    RADIOLOGY  CT angio left lower leg Decreased arterial flow to left leg ____________________________________________   PROCEDURES  Procedures  CRITICAL CARE Performed by: Nance Pear   Total critical care time: 40 minutes  Critical care time was exclusive of separately billable procedures and treating other patients.  Critical care was necessary to treat or prevent imminent or life-threatening deterioration.  Critical care was time spent personally by me on the following activities: development of treatment plan with patient and/or surrogate as well as nursing, discussions with consultants, evaluation of patient's response to treatment, examination of patient, obtaining history from patient or surrogate, ordering and performing treatments and interventions, ordering and review of laboratory studies, ordering and review of radiographic studies, pulse oximetry and re-evaluation of patient's condition.  ____________________________________________   INITIAL IMPRESSION / ASSESSMENT AND PLAN / ED COURSE  Pertinent labs & imaging results that were available during my care of the patient were reviewed by me and considered in my medical decision making (see chart for details).  Presented to the emergency department today because of concerns for left leg pain.  Exam was concerning given some coolness to the extremity and unable to Doppler pulses.  Patient did have slight mottling and decreased cap refill as well.  CT Angie does show decreased arterial flow to the left leg.  Discussed with Dr. Delana Meyer with vascular surgery.  Will admit.   ____________________________________________   FINAL CLINICAL IMPRESSION(S) / ED DIAGNOSES  Final diagnoses:  Left leg pain  Arterial occlusion     Note: This dictation was prepared with Dragon dictation. Any  transcriptional errors that result from this process are unintentional      Nance Pear, MD 05/05/17 1334    Nance Pear, MD 05/14/17 1538

## 2017-05-05 NOTE — H&P (Signed)
Please see vascular consult note   This is the patient's H&P

## 2017-05-06 ENCOUNTER — Encounter: Payer: Self-pay | Admitting: Internal Medicine

## 2017-05-06 DIAGNOSIS — I743 Embolism and thrombosis of arteries of the lower extremities: Principal | ICD-10-CM

## 2017-05-06 LAB — BASIC METABOLIC PANEL
ANION GAP: 8 (ref 5–15)
BUN: 36 mg/dL — ABNORMAL HIGH (ref 6–20)
CHLORIDE: 105 mmol/L (ref 101–111)
CO2: 27 mmol/L (ref 22–32)
Calcium: 8.4 mg/dL — ABNORMAL LOW (ref 8.9–10.3)
Creatinine, Ser: 1.24 mg/dL — ABNORMAL HIGH (ref 0.44–1.00)
GFR calc non Af Amer: 38 mL/min — ABNORMAL LOW (ref >60)
GFR, EST AFRICAN AMERICAN: 45 mL/min — AB (ref >60)
Glucose, Bld: 95 mg/dL (ref 65–99)
POTASSIUM: 3.5 mmol/L (ref 3.5–5.1)
Sodium: 140 mmol/L (ref 135–145)

## 2017-05-06 LAB — CBC
HEMATOCRIT: 37.9 % (ref 35.0–47.0)
HEMOGLOBIN: 12.3 g/dL (ref 12.0–16.0)
MCH: 29.3 pg (ref 26.0–34.0)
MCHC: 32.6 g/dL (ref 32.0–36.0)
MCV: 89.8 fL (ref 80.0–100.0)
Platelets: 177 10*3/uL (ref 150–440)
RBC: 4.22 MIL/uL (ref 3.80–5.20)
RDW: 15.3 % — ABNORMAL HIGH (ref 11.5–14.5)
WBC: 8.2 10*3/uL (ref 3.6–11.0)

## 2017-05-06 LAB — MAGNESIUM: MAGNESIUM: 2 mg/dL (ref 1.7–2.4)

## 2017-05-06 LAB — PROTIME-INR
INR: 1.71
Prothrombin Time: 19.9 seconds — ABNORMAL HIGH (ref 11.4–15.2)

## 2017-05-06 MED ORDER — ENOXAPARIN SODIUM 100 MG/ML ~~LOC~~ SOLN
1.0000 mg/kg | Freq: Two times a day (BID) | SUBCUTANEOUS | Status: DC
  Administered 2017-05-06 – 2017-05-09 (×7): 100 mg via SUBCUTANEOUS
  Filled 2017-05-06 (×8): qty 1

## 2017-05-06 MED ORDER — LORAZEPAM 2 MG/ML IJ SOLN
2.0000 mg | INTRAMUSCULAR | Status: DC | PRN
  Administered 2017-05-06 – 2017-05-08 (×2): 2 mg via INTRAVENOUS
  Filled 2017-05-06 (×3): qty 1

## 2017-05-06 MED ORDER — WARFARIN SODIUM 5 MG PO TABS
5.0000 mg | ORAL_TABLET | Freq: Once | ORAL | Status: DC
  Filled 2017-05-06: qty 1

## 2017-05-06 MED ORDER — TRAMADOL HCL 50 MG PO TABS
100.0000 mg | ORAL_TABLET | Freq: Four times a day (QID) | ORAL | Status: DC | PRN
  Administered 2017-05-08 – 2017-05-11 (×4): 100 mg via ORAL
  Filled 2017-05-06 (×4): qty 2

## 2017-05-06 MED ORDER — WARFARIN - PHARMACIST DOSING INPATIENT
Freq: Every day | Status: DC
  Administered 2017-05-06 – 2017-05-09 (×3)

## 2017-05-06 MED ORDER — TRAMADOL HCL 50 MG PO TABS: 50.0000 mg | ORAL_TABLET | Freq: Four times a day (QID) | ORAL | Status: DC | PRN

## 2017-05-06 MED ORDER — WARFARIN SODIUM 5 MG PO TABS
5.0000 mg | ORAL_TABLET | Freq: Once | ORAL | Status: AC
  Administered 2017-05-06: 5 mg via ORAL
  Filled 2017-05-06: qty 1

## 2017-05-06 NOTE — Progress Notes (Signed)
ANTICOAGULATION CONSULT NOTE   Pharmacy Consult for Lovenox Indication: arterial clot  Allergies  Allergen Reactions  . Promethazine Anaphylaxis  . Prempro [Conj Estrog-Medroxyprogest Ace] Other (See Comments)    Pin in my breast  . Evista [Raloxifene] Other (See Comments)    Breast pain  . Zoloft [Sertraline Hcl] Other (See Comments)    Pt does not remember what type of reaction.    Patient Measurements: Height: 5\' 5"  (165.1 cm) Weight: 221 lb 9 oz (100.5 kg) IBW/kg (Calculated) : 57 Heparin Dosing Weight: 73.1 kg  Vital Signs: Temp: 97.4 F (36.3 C) (03/10 0800) Temp Source: Oral (03/10 0300) BP: 102/89 (03/10 1200) Pulse Rate: 86 (03/10 1200)  Labs: Recent Labs    05/05/17 1135 05/06/17 0509  HGB 14.3 12.3  HCT 44.6 37.9  PLT 212 177  APTT 31  --   LABPROT 15.7* 19.9*  INR 1.26 1.71  CREATININE 1.38* 1.24*    Estimated Creatinine Clearance: 39 mL/min (A) (by C-G formula based on SCr of 1.24 mg/dL (H)).   Medical History: Past Medical History:  Diagnosis Date  . Arthritis   . Asthma   . Atrial fibrillation, unspecified   . Cancer (HCC)    rt side  . COPD (chronic obstructive pulmonary disease) ([place])   . Coronary artery disease   . Depression   . Dysrhythmia    A-fib  . Sleep apnea     Medications:  Patient takes warfarin 4mg  at bedtime daily. Her last dose was 3/8 @ 2000.   Assessment: 84 yo female presented to ED with complaints of swelling in left leg and known history of afib. Patient underwent embolectomy per vascular surgery on 3/9. Pharmacy has been consulted to dose lovenox and warfarin for afib.   DATE INR DOSE 3/10 1.71 5mg   Goal of Therapy:  Heparin level 0.3-0.7 units/ml Monitor platelets by anticoagulation protocol: Yes   Goal INR = 2-3 per [facility] cardiology note in Care Everywhere.  Plan:  Patient is now off heparin drip. Plan is to resume warfarin and bridge with lovenox. I have ordered Lovenox 100mg  Q12H (1mg /kg) and  warfarin 5mg  once. Follow up levels will be obtained. Pharmacy will continue to follow and dose.   Lendon Ka, PharmD Pharmacy Resident 05/06/2017,1:54 PM

## 2017-05-06 NOTE — Progress Notes (Signed)
[facility] Progess Note    SYNOPSIS   84 year old female with acute leg ischemia, status post embolectomy  ASSESSMENT/PLAN   Acute leg ischemia, status post embolectomy per vascular surgery.  Doing well at this point.  Vascular surgery to adjust anticoagulation.  Will be able to transfer to the floor per vascular surgery   INTAKE / OUTPUT:  Intake/Output Summary (Last 24 hours) at 05/06/2017 1012 Last data filed at 05/06/2017 0500 Gross per 24 hour  Intake 143.45 ml  Output 1600 ml  Net -1456.55 ml    Name: Madeline Cunningham MRN: [id number] DOB: 02-Oct-1929    ADMISSION DATE:  05/05/2017  SUBJECTIVE:   Patient doing very well today, denies any leg pain, no difficulties noted  VITAL SIGNS: Temp:  [97.4 F (36.3 C)-98.7 F (37.1 C)] 97.4 F (36.3 C) (03/10 0800) Pulse Rate:  [63-94] 77 (03/10 0900) Resp:  [10-20] 15 (03/10 0900) BP: (85-185)/(46-104) 113/67 (03/10 0900) SpO2:  [92 %-100 %] 96 % (03/10 0900) Weight:  [97.5 kg (215 lb)-100.1 kg (220 lb 10.9 oz)] 100.1 kg (220 lb 10.9 oz) (03/09 1631)  PHYSICAL EXAMINATION: Physical Examination:   VS: BP 113/67   Pulse 77   Temp (!) 97.4 F (36.3 C)   Resp 15   Ht 5\' 5"  (1.651 m)   Wt 100.1 kg (220 lb 10.9 oz)   SpO2 96%   BMI 36.72 kg/m   General Appearance: No distress  Neuro:without focal findings, mental status normal. Pulmonary: normal breath sounds   CardiovascularNormal S1,S2.  No m/r/g.   Abdomen: Benign, Soft, non-tender. Extremities: Lower extremity is warm to the touch, palpable dorsalis pedis pulse on exam  LABORATORY PANEL:   CBC Recent Labs  Lab 05/06/17 0509  WBC 8.2  HGB 12.3  HCT 37.9  PLT 177    Chemistries  Recent Labs  Lab 05/06/17 0509  NA 140  K 3.5  CL 105  CO2 27  GLUCOSE 95  BUN 36*  CREATININE 1.24*  CALCIUM 8.4*  MG 2.0    Recent Labs  Lab 05/05/17 1630  GLUCAP 119*   No results for input(s): PHART, PCO2ART, PO2ART in the last 168  hours. No results for input(s): AST, ALT, ALKPHOS, BILITOT, ALBUMIN in the last 168 hours.  Cardiac Enzymes No results for input(s): TROPONINI in the last 168 hours.  RADIOLOGY:  Ct Angio Low Extrem Left W &/or Wo Contrast  Result Date: 05/05/2017 CLINICAL DATA:  Loss of feelings in legs.  Evaluate for PAD. EXAM: CT ANGIOGRAPHY OF ABDOMINAL AORTA WITH ILIOFEMORAL RUNOFF TECHNIQUE: Multidetector CT imaging of the abdomen, pelvis and lower extremities was performed using the standard protocol during bolus administration of intravenous contrast. Multiplanar CT image reconstructions and MIPs were obtained to evaluate the vascular anatomy. CONTRAST:  1106mL ISOVUE-370 IOPAMIDOL (ISOVUE-370) INJECTION 76% COMPARISON:  None FINDINGS: VASCULAR Aorta: Moderate amount of predominantly calcified though slightly irregular atherosclerotic plaque scattered throughout a normal caliber abdominal aorta. No abdominal aortic dissection or periaortic stranding. Celiac: Minimal amount of atherosclerotic plaque involving the origin the celiac artery, not resulting in hemodynamically significant stenosis. Conventional branching pattern. SMA: Atherosclerotic plaque involving the origin and main trunk of the SMA (representative image 34, series 6), not resulting in hemodynamically significant stenosis. A replaced right hepatic artery arises from the proximal SMA. 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 Both renal arteries are diminutive though appear patent. Moderate to large amount of mixed calcified and noncalcified atherosclerotic plaque involving the origin of the left  renal artery approaches approximately 50% luminal narrowing (image 35, series 6, coronal images 70 and 71, series 7). IMA: Remains patent. RIGHT Lower Extremity Inflow: Noncalcified atherosclerotic plaque within the right common iliac artery, not resulting in hemodynamically significant stenosis. The right internal iliac artery is disease though patent and  of normal caliber. The right external iliac artery is widely patent without hemodynamically significant stenosis. Outflow: Mixed calcified and noncalcified atherosclerotic plaque within the right common femoral artery, not resulting in hemodynamically significant stenosis. The right deep femoral artery is widely patent throughout its imaged course. Minimal amount of calcified atherosclerotic plaque involving the proximal aspect of the right superficial femoral artery (image 112, series 6), not resulting in hemodynamically significant stenosis. Atherosclerotic plaque within the right above and below-knee popliteal artery, not resulting in hemodynamically significant stenosis. Runoff: Three-vessel runoff to the right lower leg and foot. The right-sided dorsalis pedis artery is patent to the level of the midfoot. LEFT Lower Extremity Inflow: Atherosclerotic plaque within the left common iliac artery, not resulting in a hemodynamically significant stenosis. The left internal iliac artery is diseased though patent and of normal caliber. The left external iliac artery is widely patent. Outflow: The left common femoral artery is obscured secondary streak artifact from the patient's left total hip prosthesis. The left deep femoral artery appears patent throughout its imaged course. There is age-indeterminate occlusion of the left superficial femoral artery at the level of the proximal thigh (image 151, series 6) with reconstitution at the level of the abductor canal via deep thigh collaterals (image 193, series 6). Atretic flow was seen throughout the remainder of the distal aspect of the superficial femoral artery extending to both the left above and below-knee popliteal arteries. Runoff: Atretic flow was seen within the proximal aspects of all 3 tibial arteries, however all vessels are non-opacified at the level of the proximal calf, potentially secondary to out running the contrast bolus due to proximal occlusion. Veins:  Pelvic venous system and IVC appear widely patent on this arterial phase examination. Note made of a tiny circumaortic left renal vein. Review of the MIP images confirms the above findings. NON-VASCULAR Evaluation the abdominal organs is limited to the arterial phase of enhancement. Lower chest: Limited visualization of the lower thorax demonstrates approximately 1.4 x 0.7 cm nodular opacity with the peripheral aspect of the imaged left lower lobe (image 3, series 6). Extensive interstitial thickening within the imaged lung bases. No focal airspace opacities. No pleural effusion. Suspected cardiomegaly. There is reflux of contrast into the intrahepatic venous system. Hepatobiliary: Normal hepatic contour. There is reflux of injected contrast into the hepatic venous system. No discrete hepatic lesions. Normal appearance of the gallbladder given degree of distention. No radiopaque gallstones. No intra extrahepatic biliary ductal dilatation. No ascites. Pancreas: Normal appearance of the pancreas Spleen: Normal appearance of the spleen Adrenals/Urinary Tract: There is symmetric enhancement of the bilateral kidneys. Note is made of extra renal pelves bilaterally. Note is made of a approximately 0.8 cm hypoattenuating partially exophytic left-sided renal lesion, too small to accurately characterize. No discrete right-sided renal lesions. No definite renal stones this postcontrast examination. No urine obstruction or perinephric stranding. Normal appearance of the bilateral adrenal glands. Normal appearance of the urinary bladder. Stomach/Bowel: Large colonic stool burden without evidence of enteric obstruction. Normal appearance of the terminal ileum. The appendix is not visualized, however there is no pericecal inflammatory change. No pneumoperitoneum, pneumatosis or portal venous gas. Lymphatic: Scattered retroperitoneal lymph nodes are numerous though individually not  enlarged by size criteria with index aortocaval  lymph node measuring 0.9 cm in greatest short axis diameter (image 46, series 6). No definitive bulky retroperitoneal, mesenteric, pelvic or inguinal lymphadenopathy. Reproductive: Note is made of a peripherally calcified (approximately 1 cm) left-sided adnexal structure. Otherwise, normal appearance of the pelvic organs for age. No free fluid in the pelvic cul-de-sac. Other: Diffuse body wall anasarca with subcutaneous edema seen about the midline of the low back. Bilateral lower extremity subcutaneous edema, left greater than right. Musculoskeletal: Grade 1 anterolisthesis of L4 upon L5 measuring approximately 1 cm with nondisplaced age-indeterminate left-sided L5 pars defect (sagittal image 121, series 8). Age-indeterminate though presumably chronic moderate (approximately 40%) compression deformity involving the superior endplate of V56. Moderate severe multilevel lumbar spine DDD, worse at L4-L5 and L5-S1. Post left bipolar hip replacement without definite evidence of hardware failure or loosening. Old right superior and inferior pubic rami fractures. IMPRESSION: VASCULAR 1. Atherosclerotic plaque within a normal caliber abdominal aorta. 2. Mixed calcified and noncalcified atherosclerotic plaque approaches 50% luminal narrowing involving the origin of the left renal artery without evidence of asymmetric left-sided renal atrophy. 3.  Aortic Atherosclerosis (ICD10-I70.0). Left lower extremity vascular Impression: 1. Age-indeterminate occlusion of the proximal aspect of the left superficial femoral artery. 2. Atretic reconstitution of the distal aspect the left superficial femoral artery with atretic flow through the left popliteal and proximal aspects of the tibial arteries. 3. No arterial flow is seen below the level of the proximal calf however this is potentially secondary to out running the contrast bolus in the setting of the more proximal occlusion. Right lower extremity vascular Impression: 1. No evidence  of a hemodynamically significant stenosis affecting the right lower extremity. Three-vessel runoff to the right lower leg and foot. Right-sided dorsalis pedis artery is patent to the level the midfoot. _________________________________________________________ NON-VASCULAR 1. Findings suggestive of pulmonary edema with diffuse body wall anasarca (most conspicuous about the midline of the low back) and reflux of contrast into the intrahepatic venous system, nonspecific though could be seen in the setting of right-sided heart failure. Further evaluation with cardiac echo could be performed as indicated. 2. 1.4 cm subpleural nodular opacity within the imaged left lower lobe. Further evaluation with dedicated complete chest CT could be performed after the resolution of the patient's acute symptoms. 3. Grade 1 anterolisthesis of L4 upon L5 with associated age-indeterminate nondisplaced left-sided L5 pars defect. 4. Moderate to severe multilevel lumbar spine DDD, worse at L4-L5 and L5-S1. Electronically Signed   By: Sandi Mariscal M.D.   On: 05/05/2017 12:49    Hermelinda Dellen, DO 05/06/2017

## 2017-05-06 NOTE — Progress Notes (Signed)
1730 Transferred to 251 via bed. Report given to [facility]

## 2017-05-06 NOTE — Consult Note (Signed)
[place] Consultation  Dellamae Rosamilia Orner AOZ:[id number] DOB: [birth year] DOA: 05/05/2017 PCP: Kirk Ruths, MD   Requesting physician: Marcelle Overlie PA - C Date of consultation: 05/06/2017 Reason for consultation: Atrial fibrillation management  CHIEF COMPLAINT:  left leg pain   HISTORY OF PRESENT ILLNESS: Madeline Cunningham  is a 84 y.o. female with a known history of chronic atrial fibrillation, coronary artery disease, COPD, sleep apnea on anticoagulation with Coumadin as outpatient, peripheral arterial disease, hypertension, hyperlipidemia currently under vascular surgical service in the ICU.  Patient had left lower extremity pain early in the morning around 5 AM on 05/05/2017.  She was evaluated in the emergency room for the pain and her INR was subtherapeutic around 1.26.  CT angiogram revealed vascular occlusion and case was taken by vascular surgery and thrombectomy was done and patient admitted to ICU under vascular service.  Patient is Coumadin and Lovenox.  Patient seen and evaluated by me today , pain in the left lower extremity is better than when at initial presentation.  No complaints of any chest pain, shortness of breath.  No fever and chills.  INR today is 1.71  PAST MEDICAL HISTORY:   Past Medical History:  Diagnosis Date  . Arthritis   . Asthma   . Atrial fibrillation, unspecified   . Cancer (HCC)    rt side  . COPD (chronic obstructive pulmonary disease) ([place])   . Coronary artery disease   . Depression   . Dysrhythmia    A-fib  . Sleep apnea     PAST SURGICAL HISTORY:  Past Surgical History:  Procedure Laterality Date  . APPENDECTOMY    . CARDIAC CATHETERIZATION    . CARPAL TUNNEL RELEASE Right 04/27/2015   Procedure: CARPAL TUNNEL RELEASE;  Surgeon: Hessie Knows, MD;  Location: [facility] ORS;  Service: Orthopedics;  Laterality: Right;  . CATARACT EXTRACTION W/ INTRAOCULAR LENS IMPLANT Right 03/25/2007  . CATARACT EXTRACTION W/ INTRAOCULAR LENS  IMPLANT Left 2010  . FRACTURE SURGERY Left    ORIF wrist    SOCIAL HISTORY:  Social History   Tobacco Use  . Smoking status: Former Smoker    Last attempt to quit: 04/20/1975    Years since quitting: 42.0  . Smokeless tobacco: Never Used  Substance Use Topics  . Alcohol use: No    FAMILY HISTORY: History reviewed. No pertinent family history.  DRUG ALLERGIES:  Allergies  Allergen Reactions  . Promethazine Anaphylaxis  . Prempro [Conj Estrog-Medroxyprogest Ace] Other (See Comments)    Pin in my breast  . Evista [Raloxifene] Other (See Comments)    Breast pain  . Zoloft [Sertraline Hcl] Other (See Comments)    Pt does not remember what type of reaction.    REVIEW OF SYSTEMS:   CONSTITUTIONAL: No fever, fatigue or weakness.  EYES: No blurred or double vision.  EARS, NOSE, AND THROAT: No tinnitus or ear pain.  RESPIRATORY: No cough, shortness of breath, wheezing or hemoptysis.  CARDIOVASCULAR: No chest pain, orthopnea, edema.  GASTROINTESTINAL: No nausea, vomiting, diarrhea or abdominal pain.  GENITOURINARY: No dysuria, hematuria.  ENDOCRINE: No polyuria, nocturia,  HEMATOLOGY: No anemia, easy bruising or bleeding SKIN: No rash or lesion. MUSCULOSKELETAL: No joint pains NEUROLOGIC: No tingling, numbness, weakness.  PSYCHIATRY: No anxiety or depression.   MEDICATIONS AT HOME:  Prior to Admission medications   Medication Sig Start Date End Date Taking? Authorizing Provider  acetaminophen (TYLENOL) 500 MG tablet Take 500 mg by mouth daily as needed for mild pain.  Yes [provider]  busPIRone (BUSPAR) 10 MG tablet Take 10 mg by mouth 2 (two) times daily.   Yes [provider]  diltiazem (TIAZAC) 240 MG 24 hr capsule Take 120 mg by mouth daily with lunch.   Yes [provider]  FLUoxetine (PROZAC) 20 MG capsule Take 20 mg by mouth every morning.   Yes [provider]  furosemide (LASIX) 20 MG tablet Take 40 mg by mouth daily.   Yes  [provider]  gabapentin (NEURONTIN) 100 MG capsule Take 200 mg by mouth 3 (three) times daily.   Yes [provider]  LORazepam (ATIVAN) 0.5 MG tablet Take 0.5 mg by mouth every 12 (twelve) hours as needed for anxiety.   Yes [provider]  metoprolol (TOPROL-XL) 200 MG 24 hr tablet Take 200 mg by mouth at bedtime.   Yes [provider]  nitroGLYCERIN (NITROSTAT) 0.4 MG SL tablet Place 0.4 mg under the tongue every 5 (five) minutes as needed for chest pain.   Yes [provider]  pravastatin (PRAVACHOL) 80 MG tablet Take 80 mg by mouth at bedtime.   Yes [provider]  ropinirole (REQUIP) 5 MG tablet Take 5 mg by mouth at bedtime.   Yes [provider]  warfarin (COUMADIN) 4 MG tablet Take 4 mg by mouth at bedtime.    Yes [provider]  HYDROcodone-acetaminophen (NORCO) 5-325 MG tablet Take 1 tablet by mouth every 6 (six) hours as needed for moderate pain. Patient not taking: Reported on 05/05/2017 04/27/15   Hessie Knows, MD      PHYSICAL EXAMINATION:   VITAL SIGNS: Blood pressure 102/89, pulse 86, temperature (!) 97.4 F (36.3 C), resp. rate (!) 22, height 5\' 5"  (1.651 m), weight 100.5 kg (221 lb 9 oz), SpO2 97 %.  GENERAL:  84 y.o.-year-old patient lying in the bed with no acute distress.  EYES: Pupils equal, round, reactive to light and accommodation. No scleral icterus. Extraocular muscles intact.  HEENT: Head atraumatic, normocephalic. Oropharynx and nasopharynx clear.  NECK:  Supple, no jugular venous distention. No thyroid enlargement, no tenderness.  LUNGS: Normal breath sounds bilaterally, no wheezing, rales,rhonchi or crepitation. No use of accessory muscles of respiration.  CARDIOVASCULAR: S1, S2 irregular. No murmurs, rubs, or gallops.  ABDOMEN: Soft, nontender, nondistended. Bowel sounds present. No organomegaly or mass.  EXTREMITIES: 1plus pedal edema,  No cyanosis, or clubbing.  NEUROLOGIC:  Cranial nerves II through XII are intact. Muscle strength 5/5 in all extremities. Sensation intact. Gait not checked.  PSYCHIATRIC: The patient is alert and oriented x 3.  SKIN: No obvious rash, lesion, or ulcer.   LABORATORY PANEL:   CBC Recent Labs  Lab 05/05/17 1135 05/06/17 0509  WBC 6.7 8.2  HGB 14.3 12.3  HCT 44.6 37.9  PLT 212 177  MCV 90.6 89.8  MCH 29.1 29.3  MCHC 32.1 32.6  RDW 15.5* 15.3*  LYMPHSABS 1.5  --   MONOABS 1.0*  --   EOSABS 0.2  --   BASOSABS 0.1  --    ------------------------------------------------------------------------------------------------------------------  Chemistries  Recent Labs  Lab 05/05/17 1135 05/06/17 0509  NA 139 140  K 3.6 3.5  CL 104 105  CO2 25 27  GLUCOSE 124* 95  BUN 44* 36*  CREATININE 1.38* 1.24*  CALCIUM 9.1 8.4*  MG  --  2.0   ------------------------------------------------------------------------------------------------------------------ estimated creatinine clearance is 39 mL/min (A) (by C-G formula based on SCr of 1.24 mg/dL (H)). ------------------------------------------------------------------------------------------------------------------ No results for input(s):  TSH, T4TOTAL, T3FREE, THYROIDAB in the last 72 hours.  Invalid input(s): FREET3   Coagulation profile Recent Labs  Lab 05/05/17 1135 05/06/17 0509  INR 1.26 1.71   ------------------------------------------------------------------------------------------------------------------- No results for input(s): DDIMER in the last 72 hours. -------------------------------------------------------------------------------------------------------------------  Cardiac Enzymes No results for input(s): CKMB, TROPONINI, MYOGLOBIN in the last 168 hours.  Invalid input(s): CK ------------------------------------------------------------------------------------------------------------------ Invalid input(s):  POCBNP  ---------------------------------------------------------------------------------------------------------------  Urinalysis    Component Value Date/Time   COLORURINE Straw 05/18/2013 0910   APPEARANCEUR Clear 05/18/2013 0910   LABSPEC 1.008 05/18/2013 0910   PHURINE 7.0 05/18/2013 0910   GLUCOSEU Negative 05/18/2013 0910   HGBUR Negative 05/18/2013 0910   BILIRUBINUR Negative 05/18/2013 0910   KETONESUR Negative 05/18/2013 0910   PROTEINUR Negative 05/18/2013 0910   NITRITE Negative 05/18/2013 0910   LEUKOCYTESUR Negative 05/18/2013 0910     RADIOLOGY: Ct Angio Low Extrem Left W &/or Wo Contrast  Result Date: 05/05/2017 CLINICAL DATA:  Loss of feelings in legs.  Evaluate for PAD. EXAM: CT ANGIOGRAPHY OF ABDOMINAL AORTA WITH ILIOFEMORAL RUNOFF TECHNIQUE: Multidetector CT imaging of the abdomen, pelvis and lower extremities was performed using the standard protocol during bolus administration of intravenous contrast. Multiplanar CT image reconstructions and MIPs were obtained to evaluate the vascular anatomy. CONTRAST:  157mL ISOVUE-370 IOPAMIDOL (ISOVUE-370) INJECTION 76% COMPARISON:  None FINDINGS: VASCULAR Aorta: Moderate amount of predominantly calcified though slightly irregular atherosclerotic plaque scattered throughout a normal caliber abdominal aorta. No abdominal aortic dissection or periaortic stranding. Celiac: Minimal amount of atherosclerotic plaque involving the origin the celiac artery, not resulting in hemodynamically significant stenosis. Conventional branching pattern. SMA: Atherosclerotic plaque involving the origin and main trunk of the SMA (representative image 34, series 6), not resulting in hemodynamically significant stenosis. A replaced right hepatic artery arises from the proximal SMA. 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 Both renal arteries are diminutive though appear patent. Moderate to large amount of mixed calcified and noncalcified  atherosclerotic plaque involving the origin of the left renal artery approaches approximately 50% luminal narrowing (image 35, series 6, coronal images 70 and 71, series 7). IMA: Remains patent. RIGHT Lower Extremity Inflow: Noncalcified atherosclerotic plaque within the right common iliac artery, not resulting in hemodynamically significant stenosis. The right internal iliac artery is disease though patent and of normal caliber. The right external iliac artery is widely patent without hemodynamically significant stenosis. Outflow: Mixed calcified and noncalcified atherosclerotic plaque within the right common femoral artery, not resulting in hemodynamically significant stenosis. The right deep femoral artery is widely patent throughout its imaged course. Minimal amount of calcified atherosclerotic plaque involving the proximal aspect of the right superficial femoral artery (image 112, series 6), not resulting in hemodynamically significant stenosis. Atherosclerotic plaque within the right above and below-knee popliteal artery, not resulting in hemodynamically significant stenosis. Runoff: Three-vessel runoff to the right lower leg and foot. The right-sided dorsalis pedis artery is patent to the level of the midfoot. LEFT Lower Extremity Inflow: Atherosclerotic plaque within the left common iliac artery, not resulting in a hemodynamically significant stenosis. The left internal iliac artery is diseased though patent and of normal caliber. The left external iliac artery is widely patent. Outflow: The left common femoral artery is obscured secondary streak artifact from the patient's left total hip prosthesis. The left deep femoral artery appears patent throughout its imaged course. There is age-indeterminate occlusion of the left superficial femoral artery at the level of the proximal thigh (image 151, series 6) with reconstitution at the level of the  abductor canal via deep thigh collaterals (image 193, series 6).  Atretic flow was seen throughout the remainder of the distal aspect of the superficial femoral artery extending to both the left above and below-knee popliteal arteries. Runoff: Atretic flow was seen within the proximal aspects of all 3 tibial arteries, however all vessels are non-opacified at the level of the proximal calf, potentially secondary to out running the contrast bolus due to proximal occlusion. Veins: Pelvic venous system and IVC appear widely patent on this arterial phase examination. Note made of a tiny circumaortic left renal vein. Review of the MIP images confirms the above findings. NON-VASCULAR Evaluation the abdominal organs is limited to the arterial phase of enhancement. Lower chest: Limited visualization of the lower thorax demonstrates approximately 1.4 x 0.7 cm nodular opacity with the peripheral aspect of the imaged left lower lobe (image 3, series 6). Extensive interstitial thickening within the imaged lung bases. No focal airspace opacities. No pleural effusion. Suspected cardiomegaly. There is reflux of contrast into the intrahepatic venous system. Hepatobiliary: Normal hepatic contour. There is reflux of injected contrast into the hepatic venous system. No discrete hepatic lesions. Normal appearance of the gallbladder given degree of distention. No radiopaque gallstones. No intra extrahepatic biliary ductal dilatation. No ascites. Pancreas: Normal appearance of the pancreas Spleen: Normal appearance of the spleen Adrenals/Urinary Tract: There is symmetric enhancement of the bilateral kidneys. Note is made of extra renal pelves bilaterally. Note is made of a approximately 0.8 cm hypoattenuating partially exophytic left-sided renal lesion, too small to accurately characterize. No discrete right-sided renal lesions. No definite renal stones this postcontrast examination. No urine obstruction or perinephric stranding. Normal appearance of the bilateral adrenal glands. Normal appearance of  the urinary bladder. Stomach/Bowel: Large colonic stool burden without evidence of enteric obstruction. Normal appearance of the terminal ileum. The appendix is not visualized, however there is no pericecal inflammatory change. No pneumoperitoneum, pneumatosis or portal venous gas. Lymphatic: Scattered retroperitoneal lymph nodes are numerous though individually not enlarged by size criteria with index aortocaval lymph node measuring 0.9 cm in greatest short axis diameter (image 46, series 6). No definitive bulky retroperitoneal, mesenteric, pelvic or inguinal lymphadenopathy. Reproductive: Note is made of a peripherally calcified (approximately 1 cm) left-sided adnexal structure. Otherwise, normal appearance of the pelvic organs for age. No free fluid in the pelvic cul-de-sac. Other: Diffuse body wall anasarca with subcutaneous edema seen about the midline of the low back. Bilateral lower extremity subcutaneous edema, left greater than right. Musculoskeletal: Grade 1 anterolisthesis of L4 upon L5 measuring approximately 1 cm with nondisplaced age-indeterminate left-sided L5 pars defect (sagittal image 121, series 8). Age-indeterminate though presumably chronic moderate (approximately 40%) compression deformity involving the superior endplate of K99. Moderate severe multilevel lumbar spine DDD, worse at L4-L5 and L5-S1. Post left bipolar hip replacement without definite evidence of hardware failure or loosening. Old right superior and inferior pubic rami fractures. IMPRESSION: VASCULAR 1. Atherosclerotic plaque within a normal caliber abdominal aorta. 2. Mixed calcified and noncalcified atherosclerotic plaque approaches 50% luminal narrowing involving the origin of the left renal artery without evidence of asymmetric left-sided renal atrophy. 3.  Aortic Atherosclerosis (ICD10-I70.0). Left lower extremity vascular Impression: 1. Age-indeterminate occlusion of the proximal aspect of the left superficial femoral  artery. 2. Atretic reconstitution of the distal aspect the left superficial femoral artery with atretic flow through the left popliteal and proximal aspects of the tibial arteries. 3. No arterial flow is seen below the level of the proximal calf however this  is potentially secondary to out running the contrast bolus in the setting of the more proximal occlusion. Right lower extremity vascular Impression: 1. No evidence of a hemodynamically significant stenosis affecting the right lower extremity. Three-vessel runoff to the right lower leg and foot. Right-sided dorsalis pedis artery is patent to the level the midfoot. _________________________________________________________ NON-VASCULAR 1. Findings suggestive of pulmonary edema with diffuse body wall anasarca (most conspicuous about the midline of the low back) and reflux of contrast into the intrahepatic venous system, nonspecific though could be seen in the setting of right-sided heart failure. Further evaluation with cardiac echo could be performed as indicated. 2. 1.4 cm subpleural nodular opacity within the imaged left lower lobe. Further evaluation with dedicated complete chest CT could be performed after the resolution of the patient's acute symptoms. 3. Grade 1 anterolisthesis of L4 upon L5 with associated age-indeterminate nondisplaced left-sided L5 pars defect. 4. Moderate to severe multilevel lumbar spine DDD, worse at L4-L5 and L5-S1. Electronically Signed   By: Sandi Mariscal M.D.   On: 05/05/2017 12:49    EKG: Orders placed or performed during the hospital encounter of 04/20/15  . EKG 12-Lead  . EKG 12-Lead    IMPRESSION AND PLAN:  83 year old elderly female patient with history of COPD, coronary artery disease, atrial fibrillation, arthritis, sleep apnea status post thrombectomy of the left posterior tibial artery and left anterior tibial artery.  1.  Status post thrombectomy of left posterior tibial artery and left anterior tibial  artery Vascular surgery follow-up Anticoagulation with Lovenox subcutaneously and oral Coumadin  2.  Atrial fibrillation with controlled rate Continue anticoagulation with Lovenox and Coumadin Follow-up INR Continue oral cardizem for rate control  3.  Emphysema Oxygen via nasal cannula to keep saturation more than 92%  4.  Coronary artery disease Continue oral Plavix and statin medication  5.  Fluid overload Continue Lasix for diuresis  Thank you for consultation we will follow-up the patient   All the records are reviewed and case discussed with ED provider. Management plans discussed with the patient, family and they are in agreement.  CODE STATUS:FULL CODE    Code Status Orders  (From admission, onward)        Start     Ordered   05/05/17 1629  Full code  Continuous     05/05/17 1628    Code Status History    Date Active Date Inactive Code Status Order ID Comments User Context   This patient has a current code status but no historical code status.       TOTAL TIME TAKING CARE OF THIS PATIENT: 50 minutes.    Saundra Shelling M.D on 05/06/2017 at 3:57 PM  Between 7am to 6pm - Pager - [phone]  After 6pm go to [email or web address] - password [email or web address]  [facility]  [phone]  CC: Primary care physician; Kirk Ruths, MD

## 2017-05-06 NOTE — Progress Notes (Signed)
[facility]  Daily Progress Note   Subjective: 1 Day Post-Op: Left lower extremity angiography third order catheter placement with additional third order catheter placement, Ultrasound-guided access right common femoral artery for sheath placement, Infusion 6 mg of TPA left common femoral, Mechanical thrombectomy of the left common femoral artery with the penumbra cat 6, Mechanical thrombectomy of the left posterior tibial artery with the penumbra cat 3, Mechanical thrombectomy of the left anterior tibial artery with the penumbra cat 3 with Star close right common femoral artery  Patient without issue overnight.  Aggrastat stopped this morning.  Patient slightly confused at baseline however is doing well.  Patient not complaining of any pain to her left lower extremity.    Objective: Vitals:   05/06/17 0900 05/06/17 1000 05/06/17 1100 05/06/17 1200  BP: 113/67 (!) 115/58 130/71 102/89  Pulse: 77 69 79 86  Resp: 15 11 17  (!) 22  Temp:      TempSrc:      SpO2: 96% 93% 95% 97%  Weight:   221 lb 9 oz (100.5 kg)   Height:        Intake/Output Summary (Last 24 hours) at 05/06/2017 1335 Last data filed at 05/06/2017 0802 Gross per 24 hour  Intake 383.45 ml  Output 1600 ml  Net -1216.55 ml   Physical Exam: A&Ox1 (baseline), NAD CV: Irregularly irregular Pulmonary: CTA Bilaterally Abdomen: Soft, Nontender, Nondistended Right groin: PAD removed.  Access site is clean dry and intact.  No signs of drainage.  No signs of swelling. Vascular:  Left lower extremity: Thigh is soft.  Calf is soft.  Extremity is warm from thigh to toes.  Hard to palpate pedal pulses however the foot is warm. Moderate bilateral lower extremity edema.   Laboratory: CBC    Component Value Date/Time   WBC 8.2 05/06/2017 0509   HGB 12.3 05/06/2017 0509   HGB 10.6 (L) 05/19/2013 0120   HCT 37.9 05/06/2017 0509   HCT 33.0 (L) 05/19/2013 0120   PLT 177 05/06/2017 0509   PLT 197 05/19/2013  0120   BMET    Component Value Date/Time   NA 140 05/06/2017 0509   NA 140 05/19/2013 0120   K 3.5 05/06/2017 0509   K 4.3 05/19/2013 0120   CL 105 05/06/2017 0509   CL 110 (H) 05/19/2013 0120   CO2 27 05/06/2017 0509   CO2 27 05/19/2013 0120   GLUCOSE 95 05/06/2017 0509   GLUCOSE 95 05/19/2013 0120   BUN 36 (H) 05/06/2017 0509   BUN 18 05/19/2013 0120   CREATININE 1.24 (H) 05/06/2017 0509   CREATININE 1.09 05/19/2013 0120   CALCIUM 8.4 (L) 05/06/2017 0509   CALCIUM 8.5 05/19/2013 0120   GFRNONAA 38 (L) 05/06/2017 0509   GFRNONAA 47 (L) 05/19/2013 0120   GFRAA 45 (L) 05/06/2017 0509   GFRAA 55 (L) 05/19/2013 0120   Assessment/Planning: The patient is a 84 year old female with chronic A. fib noncompliant with Coumadin who presented to the emergency room with an ischemic left lower extremity due to an embolic event.  The patient is now status post a left lower extremity angiogram with intervention successfully revascularizing the leg - Stable 1) Aggrastat was discontinued this morning.  The patient is now on Lovenox as a bridge to Coumadin.  INR this morning 1.71 - will remain on Lovenox bridge until therapeutic.  Consulted medicine to assist with management of chronic A. Fib. 2) Will transfer the patient to telemetry as she has been  stable overnight 3) Physical therapy consult  Marcelle Overlie PA-C 05/06/2017 1:35 PM

## 2017-05-06 NOTE — Progress Notes (Signed)
[place] for tirofiban Indication: Ischemic Leg  Allergies  Allergen Reactions  . Promethazine Anaphylaxis  . Prempro [Conj Estrog-Medroxyprogest Ace] Other (See Comments)    Pin in my breast  . Evista [Raloxifene] Other (See Comments)    Breast pain  . Zoloft [Sertraline Hcl] Other (See Comments)    Pt does not remember what type of reaction.    Patient Measurements: Height: 5\' 5"  (165.1 cm) Weight: 220 lb 10.9 oz (100.1 kg) IBW/kg (Calculated) : 57  Vital Signs: Temp: 97.4 F (36.3 C) (03/10 0800) Temp Source: Oral (03/10 0300) BP: 130/71 (03/10 1100) Pulse Rate: 79 (03/10 1100)  Labs: Recent Labs    05/05/17 1135 05/06/17 0509  HGB 14.3 12.3  HCT 44.6 37.9  PLT 212 177  APTT 31  --   LABPROT 15.7* 19.9*  INR 1.26 1.71  CREATININE 1.38* 1.24*    Estimated Creatinine Clearance: 38.9 mL/min (A) (by C-G formula based on SCr of 1.24 mg/dL (H)).   Medical History: Past Medical History:  Diagnosis Date  . Arthritis   . Asthma   . Atrial fibrillation, unspecified   . Cancer (HCC)    rt side  . COPD (chronic obstructive pulmonary disease) ([place])   . Coronary artery disease   . Depression   . Dysrhythmia    A-fib  . Sleep apnea     Assessment: 84 y/o F s/p LE angiography ordered tirofiban.   Plan:  After discussion with Dr. Delana Meyer, will order half-bolus of 12.5 mcg/kg. Will initiate infusion at 0.075 mcg/kg/min with CrCl < 60 ml/min.   Mileydi Milsap A 05/06/2017,11:47 AM

## 2017-05-06 NOTE — Progress Notes (Signed)
Found patient trying to exit the bed from the end of the bed.  Bed alarm is turned on but didn't alarm.  She is agitated and refuses to move up in the bed.  Called the patients daughter is see if talking to her would be helpful, but it was not.  Dr. Posey Pronto called and obtained orders for IV ativan.

## 2017-05-07 DIAGNOSIS — I743 Embolism and thrombosis of arteries of the lower extremities: Secondary | ICD-10-CM

## 2017-05-07 LAB — CBC
HEMATOCRIT: 39.4 % (ref 35.0–47.0)
HEMOGLOBIN: 12.8 g/dL (ref 12.0–16.0)
MCH: 29.1 pg (ref 26.0–34.0)
MCHC: 32.6 g/dL (ref 32.0–36.0)
MCV: 89.4 fL (ref 80.0–100.0)
Platelets: 182 10*3/uL (ref 150–440)
RBC: 4.41 MIL/uL (ref 3.80–5.20)
RDW: 15.7 % — ABNORMAL HIGH (ref 11.5–14.5)
WBC: 9.2 10*3/uL (ref 3.6–11.0)

## 2017-05-07 LAB — BASIC METABOLIC PANEL
Anion gap: 11 (ref 5–15)
BUN: 33 mg/dL — AB (ref 6–20)
CHLORIDE: 103 mmol/L (ref 101–111)
CO2: 25 mmol/L (ref 22–32)
CREATININE: 1.35 mg/dL — AB (ref 0.44–1.00)
Calcium: 8.5 mg/dL — ABNORMAL LOW (ref 8.9–10.3)
GFR calc Af Amer: 40 mL/min — ABNORMAL LOW (ref >60)
GFR calc non Af Amer: 35 mL/min — ABNORMAL LOW (ref >60)
GLUCOSE: 98 mg/dL (ref 65–99)
Potassium: 3.6 mmol/L (ref 3.5–5.1)
Sodium: 139 mmol/L (ref 135–145)

## 2017-05-07 LAB — PROTIME-INR
INR: 1.9
Prothrombin Time: 21.6 seconds — ABNORMAL HIGH (ref 11.4–15.2)

## 2017-05-07 LAB — MAGNESIUM: Magnesium: 2.2 mg/dL (ref 1.7–2.4)

## 2017-05-07 MED ORDER — PANTOPRAZOLE SODIUM 40 MG PO TBEC
40.0000 mg | DELAYED_RELEASE_TABLET | Freq: Every day | ORAL | Status: DC
  Administered 2017-05-07 – 2017-05-10 (×4): 40 mg via ORAL
  Filled 2017-05-07 (×4): qty 1

## 2017-05-07 MED ORDER — WARFARIN SODIUM 4 MG PO TABS
4.0000 mg | ORAL_TABLET | Freq: Once | ORAL | Status: AC
  Administered 2017-05-07: 4 mg via ORAL
  Filled 2017-05-07: qty 1

## 2017-05-07 NOTE — Progress Notes (Signed)
Pt. Is awake, received her CHG bath, transferred to low bed, alert to self but continues to be disoriented to place, situation and time. She is pleasant this morning and following commands. Will continue to monitor pt.

## 2017-05-07 NOTE — Progress Notes (Signed)
Pt has gone to sleep another staff member and I got her pulled up in the bed and repositioned. Call bell and phone within reach. Bed in lowest positioning. Will continue to monitor pt.

## 2017-05-07 NOTE — Progress Notes (Signed)
Pt. Refused HS medications.

## 2017-05-07 NOTE — Plan of Care (Signed)
  Progressing Clinical Measurements: Will remain free from infection 05/07/2017 1813 - Progressing by Liliane Channel, RN Cardiovascular complication will be avoided 05/07/2017 1813 - Progressing by Liliane Channel, RN Pain Managment: General experience of comfort will improve 05/07/2017 1813 - Progressing by Liliane Channel, RN Safety: Ability to remain free from injury will improve 05/07/2017 1813 - Progressing by Liliane Channel, RN Skin Integrity: Risk for impaired skin integrity will decrease 05/07/2017 1813 - Progressing by Liliane Channel, RN

## 2017-05-07 NOTE — Progress Notes (Signed)
[place] [facility] [place] NAME: Madeline Cunningham    MR#:  [id number]  DATE OF BIRTH:  1930/02/15  SUBJECTIVE:  CHIEF COMPLAINT:  Pt is out of bed to chair, no leg pain   REVIEW OF SYSTEMS:  CONSTITUTIONAL: No fever, fatigue or weakness.  EYES: No blurred or double vision.  EARS, NOSE, AND THROAT: No tinnitus or ear pain.  RESPIRATORY: No cough, shortness of breath, wheezing or hemoptysis.  CARDIOVASCULAR: No chest pain, orthopnea, edema.  GASTROINTESTINAL: No nausea, vomiting, diarrhea or abdominal pain.  GENITOURINARY: No dysuria, hematuria.  ENDOCRINE: No polyuria, nocturia,  HEMATOLOGY: No anemia, easy bruising or bleeding SKIN: No rash or lesion. MUSCULOSKELETAL: No joint pain or arthritis.   NEUROLOGIC: No tingling, numbness, weakness.  PSYCHIATRY: No anxiety or depression.   DRUG ALLERGIES:   Allergies  Allergen Reactions  . Promethazine Anaphylaxis  . Prempro [Conj Estrog-Medroxyprogest Ace] Other (See Comments)    Pin in my breast  . Evista [Raloxifene] Other (See Comments)    Breast pain  . Zoloft [Sertraline Hcl] Other (See Comments)    Pt does not remember what type of reaction.    VITALS:  Blood pressure 140/66, pulse 72, temperature 98.4 F (36.9 C), temperature source Oral, resp. rate 18, height 5\' 5"  (1.651 m), weight 100.5 kg (221 lb 9 oz), SpO2 93 %.  PHYSICAL EXAMINATION:  GENERAL:  84 y.o.-year-old patient lying in the bed with no acute distress.  EYES: Pupils equal, round, reactive to light and accommodation. No scleral icterus. Extraocular muscles intact.  HEENT: Head atraumatic, normocephalic. Oropharynx and nasopharynx clear.  NECK:  Supple, no jugular venous distention. No thyroid enlargement, no tenderness.  LUNGS: Normal breath sounds bilaterally, no wheezing, rales,rhonchi or crepitation. No use of accessory muscles of respiration.  CARDIOVASCULAR: S1, S2 normal. No murmurs, rubs, or gallops.   ABDOMEN: Soft, nontender, nondistended. Bowel sounds present. No organomegaly or mass.  EXTREMITIES: No pedal edema, cyanosis, or clubbing.  NEUROLOGIC: Cranial nerves II through XII are intact. Muscle strength 5/5 in all extremities. Sensation intact. Gait not checked.  PSYCHIATRIC: The patient is alert and oriented x 3.  SKIN: No obvious rash, lesion, or ulcer.    LABORATORY PANEL:   CBC Recent Labs  Lab 05/07/17 0812  WBC 9.2  HGB 12.8  HCT 39.4  PLT 182   ------------------------------------------------------------------------------------------------------------------  Chemistries  Recent Labs  Lab 05/07/17 0812  NA 139  K 3.6  CL 103  CO2 25  GLUCOSE 98  BUN 33*  CREATININE 1.35*  CALCIUM 8.5*  MG 2.2   ------------------------------------------------------------------------------------------------------------------  Cardiac Enzymes No results for input(s): TROPONINI in the last 168 hours. ------------------------------------------------------------------------------------------------------------------  RADIOLOGY:  No results found.  EKG:   Orders placed or performed during the hospital encounter of 04/20/15  . EKG 12-Lead  . EKG 12-Lead    ASSESSMENT AND PLAN:   84 year old elderly female patient with history of COPD, coronary artery disease, atrial fibrillation, arthritis, sleep apnea status post thrombectomy of the left posterior tibial artery and left anterior tibial artery.  1.  Status post thrombectomy of left posterior tibial artery and left anterior tibial artery Vascular surgery follow-up Anticoagulation with Lovenox subcutaneously and oral Coumadin   2.  Atrial fibrillation with controlled rate Continue anticoagulation with Lovenox and Coumadin  INR 1.9  Continue oral cardizem for rate control  3.  Emphysema Oxygen via nasal cannula to keep saturation more than 92%  4.  Coronary artery disease Continue oral  Plavix and statin  medication  5.  Fluid overload Continue Lasix for diuresis Creatinine 1.35 check BMP in a.m.        All the records are reviewed and case discussed with Care Management/Social Workerr. Management plans discussed with the patient, family and they are in agreement.  CODE STATUS: fc  TOTAL TIME TAKING CARE OF THIS PATIENT: 35  minutes.    Note: This dictation was prepared with Dragon dictation along with smaller phrase technology. Any transcriptional errors that result from this process are unintentional.   Nicholes Mango M.D on 05/07/2017 at 4:05 PM  Between 7am to 6pm - Pager - [phone] After 6pm go to [email or web address] - password [email or web address] [facility]  [place] [facility]  Office  [phone]  CC: Primary care physician; Kirk Ruths, MD

## 2017-05-07 NOTE — Progress Notes (Signed)
ANTICOAGULATION CONSULT NOTE   Pharmacy Consult for Lovenox Indication: arterial clot  Allergies  Allergen Reactions  . Promethazine Anaphylaxis  . Prempro [Conj Estrog-Medroxyprogest Ace] Other (See Comments)    Pin in my breast  . Evista [Raloxifene] Other (See Comments)    Breast pain  . Zoloft [Sertraline Hcl] Other (See Comments)    Pt does not remember what type of reaction.    Patient Measurements: Height: 5\' 5"  (165.1 cm) Weight: 221 lb 9 oz (100.5 kg) IBW/kg (Calculated) : 57 Heparin Dosing Weight: 73.1 kg  Vital Signs: Temp: 98.4 F (36.9 C) (03/11 0738) Temp Source: Oral (03/11 0738) BP: 140/66 (03/11 1307) Pulse Rate: 72 (03/11 1115)  Labs: Recent Labs    05/05/17 1135 05/06/17 0509 05/07/17 0812  HGB 14.3 12.3 12.8  HCT 44.6 37.9 39.4  PLT 212 177 182  APTT 31  --   --   LABPROT 15.7* 19.9* 21.6*  INR 1.26 1.71 1.90  CREATININE 1.38* 1.24* 1.35*    Estimated Creatinine Clearance: 35.8 mL/min (A) (by C-G formula based on SCr of 1.35 mg/dL (H)).   Medical History: Past Medical History:  Diagnosis Date  . Arthritis   . Asthma   . Atrial fibrillation, unspecified   . Cancer (HCC)    rt side  . COPD (chronic obstructive pulmonary disease) ([place])   . Coronary artery disease   . Depression   . Dysrhythmia    A-fib  . Sleep apnea     Medications:  Patient takes warfarin 4mg  at bedtime daily. Her last dose was 3/8 @ 2000.   Assessment: 84 yo female presented to ED with complaints of swelling in left leg and known history of afib. Patient underwent embolectomy per vascular surgery on 3/9. Pharmacy has been consulted to dose lovenox and warfarin for afib.   DATE INR DOSE 3/9       1.26     4mg  3/10 1.71 5mg  3/11     1.90  Goal of Therapy:  Goal INR = 2-3 per [facility] cardiology note in Care Everywhere.  Plan:  Patient has resumed warfarin and bridge with lovenox. Continue Lovenox 100mg  Q12H (1mg /kg) anticipate that INR will be therapeutic  tomorrow. Will order Warfarin 4mg  times one tonight. Follow up levels will be obtained. Pharmacy will continue to follow and dose.   Paulina Fusi, PharmD, BCPS 05/07/2017 1:50 PM

## 2017-05-07 NOTE — Progress Notes (Signed)
PHARMACIST - PHYSICIAN COMMUNICATION  DR:   Delana Meyer  CONCERNING: IV to Oral Route Change Policy  RECOMMENDATION: This patient is receiving Protonix by the intravenous route.  Based on criteria approved by the Pharmacy and Therapeutics Committee, the intravenous medication(s) is/are being converted to the equivalent oral dose form(s).   DESCRIPTION: These criteria include:  The patient is eating (either orally or via tube) and/or has been taking other orally administered medications for a least 24 hours  The patient has no evidence of active gastrointestinal bleeding or impaired GI absorption (gastrectomy, short bowel, patient on TNA or NPO).  If you have questions about this conversion, please contact the Pharmacy Department  []   [phone] )  [facility] []   [phone] )  [facility] []   [phone] )  [facility] []   [phone] )  [facility] []   [phone] )  [place], [facility] 05/07/2017 1:52 PM

## 2017-05-07 NOTE — Progress Notes (Signed)
[facility]  Daily Progress Note   Subjective  - 2 Days Post-angiogram with embolectomy in specials  Patient denies pain, no groin c/o   Objective Vitals:   05/07/17 1307 05/07/17 1805 05/07/17 1852 05/07/17 1959  BP: 140/66 (!) 117/49  (!) 121/48  Pulse:  70  78  Resp:  20    Temp:  99.5 F (37.5 C) 98.2 F (36.8 C) 98.3 F (36.8 C)  TempSrc:  Oral  Oral  SpO2:  100%  100%  Weight:      Height:        Intake/Output Summary (Last 24 hours) at 05/07/2017 2253 Last data filed at 05/07/2017 2226 Gross per 24 hour  Intake 240 ml  Output 500 ml  Net -260 ml    PULM  Normal effort , no use of accessory muscles CV  No JVD, RRR Abd      No distended, nontender VASC  Left foot warm to touch with trace PT pulse palpable, right groin CD&I  Laboratory CBC    Component Value Date/Time   WBC 9.2 05/07/2017 0812   HGB 12.8 05/07/2017 0812   HGB 10.6 (L) 05/19/2013 0120   HCT 39.4 05/07/2017 0812   HCT 33.0 (L) 05/19/2013 0120   PLT 182 05/07/2017 0812   PLT 197 05/19/2013 0120    BMET    Component Value Date/Time   NA 139 05/07/2017 0812   NA 140 05/19/2013 0120   K 3.6 05/07/2017 0812   K 4.3 05/19/2013 0120   CL 103 05/07/2017 0812   CL 110 (H) 05/19/2013 0120   CO2 25 05/07/2017 0812   CO2 27 05/19/2013 0120   GLUCOSE 98 05/07/2017 0812   GLUCOSE 95 05/19/2013 0120   BUN 33 (H) 05/07/2017 0812   BUN 18 05/19/2013 0120   CREATININE 1.35 (H) 05/07/2017 0812   CREATININE 1.09 05/19/2013 0120   CALCIUM 8.5 (L) 05/07/2017 0812   CALCIUM 8.5 05/19/2013 0120   GFRNONAA 35 (L) 05/07/2017 0812   GFRNONAA 47 (L) 05/19/2013 0120   GFRAA 40 (L) 05/07/2017 0812   GFRAA 55 (L) 05/19/2013 0120    Assessment/Planning: POD #2 s/p angiogram with embolectomy in specials   Continue coumadin  Increase activity  Madeline Cunningham  05/07/2017, 10:53 PM

## 2017-05-07 NOTE — Evaluation (Signed)
Physical Therapy Evaluation Patient Details Name: Madeline Cunningham MRN: [id number] DOB: 02/27/1930 Today's Date: 05/07/2017   History of Present Illness  Pt is a 84 y.o. F, presented to hospital w/ L leg pain and BLE swelling.  Pt admitted 05/05/17 w/ diagnosis of L ischemic leg, pain, and edema. Pt  had procedure 05/05/17 of LLE arterial embolectomy w/ catheter placement.  PMHx includes: L hip replacement, arthritis, asthma, AFIB, cancer, COPD, CAD, depression, dysrhythmia, AFIB, sleep apnea.    Clinical Impression  Pt demonstrates inability to move LLE in supine and demonstrates 2/5 strength during stance and ambulation. Pt demonstrated transfers and ambulation with min assist by using BUE to offweight LLE.  However, Pt fatigues quickly due to increased effort of BUE and and RLE to support upright posture, and B knee pain 6/10.  Pt would benefit from skilled PT in order to improve strength in LLE and increase activity tolerance.     Follow Up Recommendations SNF    Equipment Recommendations  Rolling walker with 5" wheels;3in1 (PT)    Recommendations for Other Services       Precautions / Restrictions Precautions Precautions: Fall Restrictions Weight Bearing Restrictions: No      Mobility  Bed Mobility Overal bed mobility: Needs Assistance Bed Mobility: Supine to Sit     Supine to sit: HOB elevated;Min assist     General bed mobility comments: Pt required therapist to move LLE off of bed to floor.  Transfers Overall transfer level: Needs assistance Equipment used: Rolling walker (2 wheeled) Transfers: Sit to/from [facility] Sit to Stand: Min assist;Mod assist Stand pivot transfers: Min assist;Mod assist       General transfer comment: Pt required vc's for safe use of RW to step only half way into walker and to pull walker close before sitting down, but required min to mod assist due to inability to weight bear on LLE and balance. Pt provided vc's to push  through BUE to offload LLE during stance phase.  Ambulation/Gait Ambulation/Gait assistance: Min assist;Mod assist Ambulation Distance (Feet): 10 Feet Assistive device: Rolling walker (2 wheeled) Gait Pattern/deviations: Step-to pattern;Decreased stance time - left;Decreased step length - left;Decreased step length - right Gait velocity: decreased   General Gait Details: Pt reported B knee pain 6/10 and fatigue, and demonstrated flexed posture and instability during ambulation.  Pt requested to sit after 10 ft.  Stairs            Wheelchair Mobility    Modified Rankin (Stroke Patients Only)       Balance Overall balance assessment: Needs assistance Sitting-balance support: No upper extremity supported;Feet supported Sitting balance-Leahy Scale: Good Sitting balance - Comments: Pt demonstrated good sitting balance when managing clothing while sitting EOB and on bedside commode.   Standing balance support: Bilateral upper extremity supported Standing balance-Leahy Scale: Poor Standing balance comment: Pt required RW in order to stand prior to chair transfer and ambulation.                             Pertinent Vitals/Pain Pain Assessment: 0-10 Pain Score: 6 (during ambulation w/ RW) Pain Location: B knees Pain Descriptors / Indicators: Sore;Aching Pain Intervention(s): Limited activity within patient's tolerance;Monitored during session;Repositioned    Home Living Family/patient expects to be discharged to:: Private residence Living Arrangements: Spouse/significant other Available Help at Discharge: Family Type of Home: House Home Access: Level entry     Home Layout: One level Home  Equipment: Gilford Rile - 2 wheels;Cane - single point Additional Comments: Pt has history of memory deficits, information provided cannot be relied upon because family or friends were not present to verify.    Prior Function Level of Independence: Independent                Hand Dominance        Extremity/Trunk Assessment   Upper Extremity Assessment Upper Extremity Assessment: Overall WFL for tasks assessed    Lower Extremity Assessment Lower Extremity Assessment: LLE deficits/detail LLE Deficits / Details: Pt demonstrated 2/5 strength in LLE when transferring from bed to chair and during ambulation of 10 ft. Pt stated "It just won't move". LLE: Unable to fully assess due to immobilization    Cervical / Trunk Assessment Cervical / Trunk Assessment: Kyphotic  Communication   Communication: No difficulties  Cognition Arousal/Alertness: Awake/alert Behavior During Therapy: WFL for tasks assessed/performed Overall Cognitive Status: Within Functional Limits for tasks assessed                                 General Comments: Pt A&O x 3. Pt oriented to self, time, place. However, did not remember why she was in the hospital.       General Comments  Pt agreeable to session.    Exercises     Assessment/Plan    PT Assessment Patient needs continued PT services  PT Problem List Decreased strength;Decreased range of motion;Decreased activity tolerance;Decreased balance;Decreased mobility;Decreased coordination;Decreased knowledge of use of DME;Decreased safety awareness;Pain       PT Treatment Interventions DME instruction;Gait training;Functional mobility training;Therapeutic activities;Therapeutic exercise;Balance training;Patient/family education    PT Goals (Current goals can be found in the Care Plan section)  Acute Rehab PT Goals Patient Stated Goal: I want to be able to walk again. PT Goal Formulation: With patient Time For Goal Achievement: 05/21/17 Potential to Achieve Goals: Fair    Frequency Min 2X/week   Barriers to discharge        Co-evaluation               AM-PAC PT "6 Clicks" Daily Activity  Outcome Measure Difficulty turning over in bed (including adjusting bedclothes, sheets and blankets)?:  A Little Difficulty moving from lying on back to sitting on the side of the bed? : Unable Difficulty sitting down on and standing up from a chair with arms (e.g., wheelchair, bedside commode, etc,.)?: Unable Help needed moving to and from a bed to chair (including a wheelchair)?: A Little Help needed walking in hospital room?: A Little Help needed climbing 3-5 steps with a railing? : A Lot 6 Click Score: 13    End of Session Equipment Utilized During Treatment: Gait belt Activity Tolerance: Patient limited by fatigue;Patient limited by pain Patient left: in chair;with call bell/phone within reach;with chair alarm set Nurse Communication: Mobility status(NT notified.) PT Visit Diagnosis: Unsteadiness on feet (R26.81);Other abnormalities of gait and mobility (R26.89);Muscle weakness (generalized) (M62.81);Difficulty in walking, not elsewhere classified (R26.2);Pain Pain - Right/Left: (B knees) Pain - part of body: Knee    Time: 2993-7169 PT Time Calculation (min) (ACUTE ONLY): 33 min   Charges:         PT G Codes:        Shoichi Mielke Mondrian-Pardue, DPT 05/07/2017, 12:12 PM

## 2017-05-07 NOTE — Progress Notes (Signed)
Pt. Has been very confused and impulsive. She is sitting on edge of bed at foot of bed refusing to get back into the bed. She keeps stating she needs to go check on her husband, pt. Continuously re-oriented that she's in hospital. She has needed 1:1 staff attention. Will continue to monitor pt.

## 2017-05-08 ENCOUNTER — Encounter: Payer: Self-pay | Admitting: Vascular Surgery

## 2017-05-08 DIAGNOSIS — I743 Embolism and thrombosis of arteries of the lower extremities: Secondary | ICD-10-CM

## 2017-05-08 LAB — PROTIME-INR
INR: 1.84
Prothrombin Time: 21.1 seconds — ABNORMAL HIGH (ref 11.4–15.2)

## 2017-05-08 LAB — BASIC METABOLIC PANEL
Anion gap: 10 (ref 5–15)
BUN: 34 mg/dL — AB (ref 6–20)
CHLORIDE: 107 mmol/L (ref 101–111)
CO2: 23 mmol/L (ref 22–32)
Calcium: 8.4 mg/dL — ABNORMAL LOW (ref 8.9–10.3)
Creatinine, Ser: 1.29 mg/dL — ABNORMAL HIGH (ref 0.44–1.00)
GFR calc Af Amer: 43 mL/min — ABNORMAL LOW (ref >60)
GFR calc non Af Amer: 37 mL/min — ABNORMAL LOW (ref >60)
GLUCOSE: 101 mg/dL — AB (ref 65–99)
POTASSIUM: 3.4 mmol/L — AB (ref 3.5–5.1)
Sodium: 140 mmol/L (ref 135–145)

## 2017-05-08 LAB — MAGNESIUM: Magnesium: 2.1 mg/dL (ref 1.7–2.4)

## 2017-05-08 LAB — CBC
HEMATOCRIT: 36.1 % (ref 35.0–47.0)
HEMOGLOBIN: 12 g/dL (ref 12.0–16.0)
MCH: 29.8 pg (ref 26.0–34.0)
MCHC: 33.3 g/dL (ref 32.0–36.0)
MCV: 89.7 fL (ref 80.0–100.0)
Platelets: 170 10*3/uL (ref 150–440)
RBC: 4.02 MIL/uL (ref 3.80–5.20)
RDW: 15.7 % — ABNORMAL HIGH (ref 11.5–14.5)
WBC: 7 10*3/uL (ref 3.6–11.0)

## 2017-05-08 MED ORDER — WARFARIN SODIUM 5 MG PO TABS
5.0000 mg | ORAL_TABLET | Freq: Once | ORAL | Status: AC
  Administered 2017-05-08: 5 mg via ORAL
  Filled 2017-05-08: qty 1

## 2017-05-08 NOTE — NC FL2 (Signed)
[place] LEVEL OF CARE SCREENING TOOL     IDENTIFICATION  Patient Name: Madeline Cunningham Birthdate: 1930/01/18 Sex: female Admission Date (Current Location): 05/05/2017  [facility] and [place] Number:  Engineering geologist and Address:  [facility], [address] [place], [place] [address]      Provider Number: [id number]  Attending Physician Name and Address:  Katha Cabal, MD  Relative Name and Phone Number:  Cox,Tina L Daughter [phone] or Lauriel, Helin [phone]     Current Level of Care: Hospital Recommended Level of Care: [place] Prior Approval Number:    Date Approved/Denied:   PASRR Number: Pending  Discharge Plan: SNF    Current Diagnoses: Patient Active Problem List   Diagnosis Date Noted  . Ischemic leg 05/05/2017  . 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 05/05/2017    Orientation RESPIRATION BLADDER Height & Weight     Self, Place  Normal Continent Weight: 211 lb (95.7 kg) Height:  5\' 5"  (165.1 cm)  BEHAVIORAL SYMPTOMS/MOOD NEUROLOGICAL BOWEL NUTRITION STATUS      Continent Diet(Regular)  AMBULATORY STATUS COMMUNICATION OF NEEDS Skin   Limited Assist   Normal                       Personal Care Assistance Level of Assistance  Bathing, Feeding, Dressing Bathing Assistance: Limited assistance Feeding assistance: Independent Dressing Assistance: Limited assistance     Functional Limitations Info  Hearing, Speech, Sight Sight Info: Adequate Hearing Info: Adequate Speech Info: Adequate    SPECIAL CARE FACTORS FREQUENCY  PT (By licensed PT)     PT Frequency: 5x a week              Contractures Contractures Info: Not present    Additional Factors Info  Allergies, Code Status, Isolation Precautions, Psychotropic Code Status Info: Full Code Allergies Info: PROMETHAZINE, PREMPRO CONJ ESTROG-MEDROXYPROGEST ACE, EVISTA RALOXIFENE,  ZOLOFT SERTRALINE HCL  Psychotropic Info: busPIRone (BUSPAR) tablet 10 mg FLUoxetine (PROZAC) capsule 20 mg    Isolation Precautions Info: Contact precautions for MRSA     Current Medications (05/08/2017):  This is the current hospital active medication list Current Facility-Administered Medications  Medication Dose Route Frequency Provider Last Rate Last Dose  . 0.9 %  sodium chloride infusion  250 mL Intravenous PRN Schnier, Dolores Lory, MD      . acetaminophen (TYLENOL) tablet 650 mg  650 mg Oral Q4H PRN Schnier, Dolores Lory, MD   650 mg at 05/07/17 1805  . busPIRone (BUSPAR) tablet 10 mg  10 mg Oral BID Schnier, Dolores Lory, MD   10 mg at 05/08/17 0848  . Chlorhexidine Gluconate Cloth 2 % PADS 6 each  6 each Topical Q0600 Conforti, John, DO   6 each at 05/08/17 0603  . clopidogrel (PLAVIX) tablet 75 mg  75 mg Oral Q breakfast Schnier, Dolores Lory, MD   75 mg at 05/08/17 0847  . diltiazem (CARDIZEM CD) 24 hr capsule 120 mg  120 mg Oral Q lunch Schnier, Dolores Lory, MD   120 mg at 05/08/17 1324  . enoxaparin (LOVENOX) injection 100 mg  1 mg/kg Subcutaneous Q12H Lifsey, Hannah C, RPH   100 mg at 05/08/17 1324  . FLUoxetine (PROZAC) capsule 20 mg  20 mg Oral Benna Dunks, Dolores Lory, MD   20 mg at 05/08/17 0602  . furosemide (LASIX) tablet 40 mg  40 mg Oral Daily Schnier, Dolores Lory, MD  40 mg at 05/08/17 0847  . gabapentin (NEURONTIN) capsule 200 mg  200 mg Oral TID Katha Cabal, MD   200 mg at 05/08/17 1710  . ipratropium-albuterol (DUONEB) 0.5-2.5 (3) MG/3ML nebulizer solution 3 mL  3 mL Nebulization Q6H PRN Conforti, John, DO      . LORazepam (ATIVAN) injection 2 mg  2 mg Intravenous Q4H PRN Dustin Flock, MD   2 mg at 05/08/17 0124  . LORazepam (ATIVAN) tablet 0.5 mg  0.5 mg Oral Q12H PRN Schnier, Dolores Lory, MD      . metoprolol succinate (TOPROL-XL) 24 hr tablet 200 mg  200 mg Oral QHS Schnier, Dolores Lory, MD   200 mg at 05/07/17 2148  . mupirocin ointment (BACTROBAN) 2 % 1 application  1  application Nasal BID Conforti, John, DO   1 application at 53/66/44 0847  . nitroGLYCERIN (NITROSTAT) SL tablet 0.4 mg  0.4 mg Sublingual Q5 min PRN Schnier, Dolores Lory, MD      . ondansetron [facility]) injection 4 mg  4 mg Intravenous Q6H PRN Schnier, Dolores Lory, MD      . pantoprazole (PROTONIX) EC tablet 40 mg  40 mg Oral QHS Vira Blanco, RPH   40 mg at 05/07/17 2148  . pravastatin (PRAVACHOL) tablet 80 mg  80 mg Oral QHS Schnier, Dolores Lory, MD   80 mg at 05/07/17 2148  . rOPINIRole (REQUIP) tablet 5 mg  5 mg Oral QHS Schnier, Dolores Lory, MD   5 mg at 05/05/17 2113  . sodium chloride flush (NS) 0.9 % injection 3 mL  3 mL Intravenous Q12H Schnier, Dolores Lory, MD   3 mL at 05/08/17 0848  . sodium chloride flush (NS) 0.9 % injection 3 mL  3 mL Intravenous PRN Schnier, Dolores Lory, MD   3 mL at 05/07/17 0924  . traMADol (ULTRAM) tablet 100 mg  100 mg Oral Q6H PRN Stegmayer, Kimberly A, PA-C      . traMADol (ULTRAM) tablet 50 mg  50 mg Oral Q6H PRN Stegmayer, Janalyn Harder, PA-C      . Warfarin - Pharmacist Dosing Inpatient   Does not apply q1800 Anette Riedel, [facility]         Discharge Medications: Please see discharge summary for a list of discharge medications.  Relevant Imaging Results:  Relevant Lab Results:   Additional Information SSN [id number]  Ross Ludwig, [place]

## 2017-05-08 NOTE — Care Management (Signed)
Anticipate discharge within the next 24 hours.  INR needs to be 2

## 2017-05-08 NOTE — Progress Notes (Signed)
[place] [facility] [place] NAME: Madeline Cunningham    MR#:  [id number]  DATE OF BIRTH:  March 09, 1929  SUBJECTIVE:  CHIEF COMPLAINT:  Pt is out of bed to chair, no leg pain , HR well controlled , wants to go home  REVIEW OF SYSTEMS:  CONSTITUTIONAL: No fever, fatigue or weakness.  EYES: No blurred or double vision.  EARS, NOSE, AND THROAT: No tinnitus or ear pain.  RESPIRATORY: No cough, shortness of breath, wheezing or hemoptysis.  CARDIOVASCULAR: No chest pain, orthopnea, edema.  GASTROINTESTINAL: No nausea, vomiting, diarrhea or abdominal pain.  GENITOURINARY: No dysuria, hematuria.  ENDOCRINE: No polyuria, nocturia,  HEMATOLOGY: No anemia, easy bruising or bleeding SKIN: No rash or lesion. MUSCULOSKELETAL: No joint pain or arthritis.   NEUROLOGIC: No tingling, numbness, weakness.  PSYCHIATRY: No anxiety or depression.   DRUG ALLERGIES:   Allergies  Allergen Reactions  . Promethazine Anaphylaxis  . Prempro [Conj Estrog-Medroxyprogest Ace] Other (See Comments)    Pin in my breast  . Evista [Raloxifene] Other (See Comments)    Breast pain  . Zoloft [Sertraline Hcl] Other (See Comments)    Pt does not remember what type of reaction.    VITALS:  Blood pressure (!) 122/58, pulse 63, temperature 98.2 F (36.8 C), temperature source Oral, resp. rate 15, height 5\' 5"  (1.651 m), weight 95.7 kg (211 lb), SpO2 96 %.  PHYSICAL EXAMINATION:  GENERAL:  84 y.o.-year-old patient lying in the bed with no acute distress.  EYES: Pupils equal, round, reactive to light and accommodation. No scleral icterus. Extraocular muscles intact.  HEENT: Head atraumatic, normocephalic. Oropharynx and nasopharynx clear.  NECK:  Supple, no jugular venous distention. No thyroid enlargement, no tenderness.  LUNGS: Normal breath sounds bilaterally, no wheezing, rales,rhonchi or crepitation. No use of accessory muscles of respiration.  CARDIOVASCULAR: S1, S2 normal. No  murmurs, rubs, or gallops.  ABDOMEN: Soft, nontender, nondistended. Bowel sounds present. No organomegaly or mass.  EXTREMITIES: No pedal edema, cyanosis, or clubbing.  NEUROLOGIC: Cranial nerves II through XII are intact. Muscle strength 5/5 in all extremities. Sensation intact. Gait not checked.  PSYCHIATRIC: The patient is alert and oriented x 3.  SKIN: No obvious rash, lesion, or ulcer.    LABORATORY PANEL:   CBC Recent Labs  Lab 05/08/17 0447  WBC 7.0  HGB 12.0  HCT 36.1  PLT 170   ------------------------------------------------------------------------------------------------------------------  Chemistries  Recent Labs  Lab 05/08/17 0447  NA 140  K 3.4*  CL 107  CO2 23  GLUCOSE 101*  BUN 34*  CREATININE 1.29*  CALCIUM 8.4*  MG 2.1   ------------------------------------------------------------------------------------------------------------------  Cardiac Enzymes No results for input(s): TROPONINI in the last 168 hours. ------------------------------------------------------------------------------------------------------------------  RADIOLOGY:  No results found.  EKG:   Orders placed or performed during the hospital encounter of 04/20/15  . EKG 12-Lead  . EKG 12-Lead    ASSESSMENT AND PLAN:   84 year old elderly female patient with history of COPD, coronary artery disease, atrial fibrillation, arthritis, sleep apnea status post thrombectomy of the left posterior tibial artery and left anterior tibial artery.  1.  Status post thrombectomy of left posterior tibial artery and left anterior tibial artery Vascular surgery follow-up Anticoagulation with Lovenox subcutaneously and oral Coumadin   2.  Atrial fibrillation with controlled rate Continue anticoagulation with Lovenox and Coumadin  INR 1.9 --1.84 Continue oral cardizem for rate control  3.  Emphysema Oxygen via nasal cannula to keep saturation more than 92%  4.  Coronary artery  disease Continue oral Plavix and statin medication  5.  Fluid overload Continue Lasix for diuresis Creatinine 1.35-1.29, at baseline      Will sign off, please feel free to contact with any questions  All the records are reviewed and case discussed with Care Management/Social Workerr. Management plans discussed with the patient, family and they are in agreement.  CODE STATUS: fc  TOTAL TIME TAKING CARE OF THIS PATIENT: 35  minutes.    Note: This dictation was prepared with Dragon dictation along with smaller phrase technology. Any transcriptional errors that result from this process are unintentional.   Nicholes Mango M.D on 05/08/2017 at 12:41 PM  Between 7am to 6pm - Pager - [phone] After 6pm go to [email or web address] - password [email or web address] [facility]  [facility]  Office  [phone]  CC: Primary care physician; Kirk Ruths, MD

## 2017-05-08 NOTE — Progress Notes (Signed)
ANTICOAGULATION CONSULT NOTE   Pharmacy Consult for Lovenox Indication: arterial clot  Allergies  Allergen Reactions  . Promethazine Anaphylaxis  . Prempro [Conj Estrog-Medroxyprogest Ace] Other (See Comments)    Pin in my breast  . Evista [Raloxifene] Other (See Comments)    Breast pain  . Zoloft [Sertraline Hcl] Other (See Comments)    Pt does not remember what type of reaction.    Patient Measurements: Height: 5\' 5"  (165.1 cm) Weight: 211 lb (95.7 kg) IBW/kg (Calculated) : 57 Heparin Dosing Weight: 73.1 kg  Vital Signs: Temp: 98.2 F (36.8 C) (03/12 0845) Temp Source: Oral (03/12 0845) BP: 122/58 (03/12 0845) Pulse Rate: 63 (03/12 0845)  Labs: Recent Labs    05/06/17 0509 05/07/17 0812 05/08/17 0447  HGB 12.3 12.8 12.0  HCT 37.9 39.4 36.1  PLT 177 182 170  LABPROT 19.9* 21.6* 21.1*  INR 1.71 1.90 1.84  CREATININE 1.24* 1.35* 1.29*    Estimated Creatinine Clearance: 36.5 mL/min (A) (by C-G formula based on SCr of 1.29 mg/dL (H)).   Medical History: Past Medical History:  Diagnosis Date  . Arthritis   . Asthma   . Atrial fibrillation, unspecified   . Cancer (HCC)    rt side  . COPD (chronic obstructive pulmonary disease) ([place])   . Coronary artery disease   . Depression   . Dysrhythmia    A-fib  . Sleep apnea     Medications:  Patient takes warfarin 4mg  at bedtime daily. Her last dose was 3/8 @ 2000.   Assessment: 84 yo female presented to ED with complaints of swelling in left leg and known history of afib. Patient underwent embolectomy per vascular surgery on 3/9. Pharmacy has been consulted to dose lovenox and warfarin for afib.   DATE INR DOSE 3/9       1.26     4mg  3/10 1.71 5mg  3/11     1.90     4mg  3/12     1.84     Goal of Therapy:  Goal INR = 2-3 per [facility] cardiology note in Care Everywhere.  Plan:  Patient has resumed warfarin and bridge with lovenox. Continue Lovenox 100mg  Q12H (1mg /kg) anticipate that INR will be therapeutic  tomorrow. Will order Warfarin 5mg  times one tonight. Follow up levels will be obtained. Pharmacy will continue to follow and dose.   Paulina Fusi, PharmD, BCPS 05/08/2017 1:58 PM

## 2017-05-08 NOTE — Progress Notes (Signed)
[facility]  Daily Progress Note   Subjective  - 3 Days Post-Op  Patient sitting in a chair eating breakfast.  She denies foot pain.  Objective Vitals:   05/07/17 1852 05/07/17 1959 05/08/17 0549 05/08/17 0845  BP:  (!) 121/48 133/61 (!) 122/58  Pulse:  78 77 63  Resp:    15  Temp: 98.2 F (36.8 C) 98.3 F (36.8 C) 98.6 F (37 C) 98.2 F (36.8 C)  TempSrc:  Oral Oral Oral  SpO2:  100% 94% 96%  Weight:   211 lb (95.7 kg)   Height:        Intake/Output Summary (Last 24 hours) at 05/08/2017 1448 Last data filed at 05/08/2017 1352 Gross per 24 hour  Intake -  Output 1350 ml  Net -1350 ml    PULM  Normal effort , no use of accessory muscles CV  No JVD, RRR Abd      No distended, nontender VASC  both right and left foot are warm to touch with brisk capillary refill.  There is 3+ edema with moderate venous stasis changes.    Laboratory CBC    Component Value Date/Time   WBC 7.0 05/08/2017 0447   HGB 12.0 05/08/2017 0447   HGB 10.6 (L) 05/19/2013 0120   HCT 36.1 05/08/2017 0447   HCT 33.0 (L) 05/19/2013 0120   PLT 170 05/08/2017 0447   PLT 197 05/19/2013 0120    BMET    Component Value Date/Time   NA 140 05/08/2017 0447   NA 140 05/19/2013 0120   K 3.4 (L) 05/08/2017 0447   K 4.3 05/19/2013 0120   CL 107 05/08/2017 0447   CL 110 (H) 05/19/2013 0120   CO2 23 05/08/2017 0447   CO2 27 05/19/2013 0120   GLUCOSE 101 (H) 05/08/2017 0447   GLUCOSE 95 05/19/2013 0120   BUN 34 (H) 05/08/2017 0447   BUN 18 05/19/2013 0120   CREATININE 1.29 (H) 05/08/2017 0447   CREATININE 1.09 05/19/2013 0120   CALCIUM 8.4 (L) 05/08/2017 0447   CALCIUM 8.5 05/19/2013 0120   GFRNONAA 37 (L) 05/08/2017 0447   GFRNONAA 47 (L) 05/19/2013 0120   GFRAA 43 (L) 05/08/2017 0447   GFRAA 55 (L) 05/19/2013 0120    Assessment/Planning: POD #3 s/p angiogram with embolectomy in specials   Continue coumadin, INR is 1.8.  Hopefully tomorrow will be greater than 2  when she can be discharged.  Patient sitting up in chair this morning which is excellent physical therapy is working with her.  I will hope to be able to discharge her tomorrow.    Madeline Cunningham  05/08/2017, 2:48 PM

## 2017-05-09 DIAGNOSIS — I743 Embolism and thrombosis of arteries of the lower extremities: Secondary | ICD-10-CM

## 2017-05-09 LAB — CBC
HCT: 34.7 % — ABNORMAL LOW (ref 35.0–47.0)
HEMOGLOBIN: 11.4 g/dL — AB (ref 12.0–16.0)
MCH: 29.4 pg (ref 26.0–34.0)
MCHC: 32.8 g/dL (ref 32.0–36.0)
MCV: 89.6 fL (ref 80.0–100.0)
Platelets: 184 10*3/uL (ref 150–440)
RBC: 3.87 MIL/uL (ref 3.80–5.20)
RDW: 15.6 % — ABNORMAL HIGH (ref 11.5–14.5)
WBC: 7.5 10*3/uL (ref 3.6–11.0)

## 2017-05-09 LAB — MAGNESIUM: MAGNESIUM: 2.1 mg/dL (ref 1.7–2.4)

## 2017-05-09 LAB — PROTIME-INR
INR: 1.62
PROTHROMBIN TIME: 19.1 s — AB (ref 11.4–15.2)

## 2017-05-09 LAB — BASIC METABOLIC PANEL
ANION GAP: 8 (ref 5–15)
BUN: 32 mg/dL — ABNORMAL HIGH (ref 6–20)
CHLORIDE: 110 mmol/L (ref 101–111)
CO2: 23 mmol/L (ref 22–32)
CREATININE: 1.12 mg/dL — AB (ref 0.44–1.00)
Calcium: 8.4 mg/dL — ABNORMAL LOW (ref 8.9–10.3)
GFR calc non Af Amer: 44 mL/min — ABNORMAL LOW (ref >60)
GFR, EST AFRICAN AMERICAN: 50 mL/min — AB (ref >60)
Glucose, Bld: 109 mg/dL — ABNORMAL HIGH (ref 65–99)
POTASSIUM: 3.6 mmol/L (ref 3.5–5.1)
SODIUM: 141 mmol/L (ref 135–145)

## 2017-05-09 MED ORDER — WARFARIN SODIUM 5 MG PO TABS
5.0000 mg | ORAL_TABLET | Freq: Every day | ORAL | Status: DC
  Administered 2017-05-09: 5 mg via ORAL
  Filled 2017-05-09: qty 1

## 2017-05-09 MED ORDER — ENOXAPARIN SODIUM 100 MG/ML ~~LOC~~ SOLN
1.0000 mg/kg | Freq: Two times a day (BID) | SUBCUTANEOUS | Status: DC
Start: 2017-05-09 — End: 2017-05-11
  Administered 2017-05-09 – 2017-05-11 (×5): 95 mg via SUBCUTANEOUS
  Filled 2017-05-09 (×6): qty 1

## 2017-05-09 NOTE — Progress Notes (Signed)
[facility]  Daily Progress Note   Subjective  - 4 Days Post-Op  Patient denies left leg or foot pain.  Again she is sitting up in a chair and has been working with physical therapy.  Objective Vitals:   05/09/17 1020 05/09/17 1032 05/09/17 1037 05/09/17 1645  BP:    122/60  Pulse: 71 (!) 107 (!) 102 69  Resp:      Temp:    98.4 F (36.9 C)  TempSrc:    Oral  SpO2: 95% 95% 95% 96%  Weight:      Height:        Intake/Output Summary (Last 24 hours) at 05/09/2017 1651 Last data filed at 05/09/2017 1604 Gross per 24 hour  Intake -  Output 650 ml  Net -650 ml    PULM  Normal effort , no use of accessory muscles CV  No JVD, RRR Abd      No distended, nontender VASC  left foot is pink and warm trace palpable posterior tibial pulse  Laboratory CBC    Component Value Date/Time   WBC 7.5 05/09/2017 0542   HGB 11.4 (L) 05/09/2017 0542   HGB 10.6 (L) 05/19/2013 0120   HCT 34.7 (L) 05/09/2017 0542   HCT 33.0 (L) 05/19/2013 0120   PLT 184 05/09/2017 0542   PLT 197 05/19/2013 0120    BMET    Component Value Date/Time   NA 141 05/09/2017 0542   NA 140 05/19/2013 0120   K 3.6 05/09/2017 0542   K 4.3 05/19/2013 0120   CL 110 05/09/2017 0542   CL 110 (H) 05/19/2013 0120   CO2 23 05/09/2017 0542   CO2 27 05/19/2013 0120   GLUCOSE 109 (H) 05/09/2017 0542   GLUCOSE 95 05/19/2013 0120   BUN 32 (H) 05/09/2017 0542   BUN 18 05/19/2013 0120   CREATININE 1.12 (H) 05/09/2017 0542   CREATININE 1.09 05/19/2013 0120   CALCIUM 8.4 (L) 05/09/2017 0542   CALCIUM 8.5 05/19/2013 0120   GFRNONAA 44 (L) 05/09/2017 0542   GFRNONAA 47 (L) 05/19/2013 0120   GFRAA 50 (L) 05/09/2017 0542   GFRAA 55 (L) 05/19/2013 0120    Assessment/Planning: POD #4s/p angiogram with embolectomy in specials   Continue coumadin, INR is down today compared to yesterday she is now 1.6.  Therefore, she cannot be discharged.  We have contacted pharmacy and she is receiving 5 mg  daily. Hopefully tomorrow or the next day her INR will be greater than 2 when she can be discharged.  Patient sitting up in chair again this afternoon physical therapy is working with her.  I will hope to be able to discharge her in the next day or 2.   Madeline Cunningham  05/09/2017, 4:51 PM

## 2017-05-09 NOTE — Plan of Care (Signed)
  Education: Knowledge of General Education information will improve 05/09/2017 1809 - Progressing by Darrelyn Hillock, RN   Education: Knowledge of General Education information will improve 05/09/2017 1809 - Progressing by Darrelyn Hillock, RN   Education: Knowledge of General Education information will improve 05/09/2017 1809 - Progressing by Darrelyn Hillock, RN   Education: Knowledge of General Education information will improve 05/09/2017 1809 - Progressing by Darrelyn Hillock, RN   Education: Knowledge of General Education information will improve 05/09/2017 1809 - Progressing by Darrelyn Hillock, RN   Education: Knowledge of General Education information will improve 05/09/2017 1809 - Progressing by Darrelyn Hillock, RN   Education: Knowledge of General Education information will improve 05/09/2017 1809 - Progressing by Darrelyn Hillock, RN   Education: Knowledge of General Education information will improve 05/09/2017 1809 - Progressing by Darrelyn Hillock, RN

## 2017-05-09 NOTE — Plan of Care (Signed)
  Cardiovascular: Ability to achieve and maintain adequate cardiovascular perfusion will improve 05/09/2017 0126 - Progressing by Jeri Cos, RN Vascular access site(s) Level 0-1 will be maintained 05/09/2017 0126 - Progressing by Jeri Cos, RN

## 2017-05-09 NOTE — Care Management (Signed)
INR has been decreasing for the past 2 days.  Pharmacy took over dosing 05/06/2017.  It is thought that the decrease in INR is based on dosing of 4mg .  Patient had 5mg  3/12 and will have 5mg  tonight.  If the INR is back on the rise, patient could continue to be bridged with Lovenox and have labs and dosing followed at the SNF.  Paged vascular to update

## 2017-05-09 NOTE — Progress Notes (Signed)
Physical Therapy Treatment Patient Details Name: Madeline Cunningham MRN: [id number] DOB: Apr 07, 1929 Today's Date: 05/09/2017    History of Present Illness Pt is a 84 y.o. F, presented to hospital w/ L leg pain and BLE swelling.  Pt admitted 05/05/17 w/ diagnosis of L ischemic leg, pain, and edema. Pt  had procedure 05/05/17 of LLE arterial embolectomy w/ catheter placement.  PMHx includes: L hip replacement, arthritis, asthma, AFIB, cancer, COPD, CAD, depression, dysrhythmia, AFIB, sleep apnea.    PT Comments    Pt required mod assist +2 for transfers from bed to [facility] and then to chair (see mobility for details).  Pt fatigues easily during stance and with stepping; Pt requested to sit after 3 steps. Pt demonstrated LLE  Strength at least 2/5 with supine exercises. Pt tolerated session fairly well, Pt fatigues quickly with transfers and pain increased from 0/10 (at rest) to 2/10 (with LLE movement). PT will focus on progressing of strengthening, ambulation, and activity tolerance at next session.   Follow Up Recommendations  SNF     Equipment Recommendations  Rolling walker with 5" wheels;3in1 (PT)    Recommendations for Other Services       Precautions / Restrictions Precautions Precautions: Fall Restrictions Weight Bearing Restrictions: No    Mobility  Bed Mobility Overal bed mobility: Needs Assistance Bed Mobility: Supine to Sit     Supine to sit: HOB elevated;Min assist;Mod assist;+2 for physical assistance     General bed mobility comments: Pt required min to mod assist x 2, therapists to move LLE off of bed to floor and move trunk into sitting position, vc's required to use hands on rails to help pull self up.  Transfers Overall transfer level: Needs assistance Equipment used: Rolling walker (2 wheeled) Transfers: Sit to/from Stand Sit to Stand: Mod assist;+2 physical assistance         General transfer comment: Pt required mod assist x 2 for sit to stand transfer to lift  body into stance and move L foot with stepping, vc's required to push throug BUE on walker when stepping with R foot.  Ambulation/Gait Ambulation/Gait assistance: Mod assist;+2 physical assistance Ambulation Distance (Feet): 6 Feet(3 ft x 2) Assistive device: Rolling walker (2 wheeled) Gait Pattern/deviations: Step-to pattern;Decreased stance time - left;Decreased step length - left;Decreased step length - right Gait velocity: decreased   General Gait Details: Pt required mod assist +2 for balance and stability during stepping to transfer from bed to [facility] and then from [facility] to chair, guarding provided to B knees in stance and L hip guarded from behind after Pt stated fatigue and requested to sit down.; vc's required tostraighten legs during stance and push through BUE on RW during stepping. Pt fatigues quickly during stance and stepping.   Stairs            Wheelchair Mobility    Modified Rankin (Stroke Patients Only)       Balance Overall balance assessment: Needs assistance Sitting-balance support: Feet supported;No upper extremity supported Sitting balance-Leahy Scale: Good Sitting balance - Comments: Pt demonstrated good sitting balance when reaching within BOS while toileting.   Standing balance support: Bilateral upper extremity supported Standing balance-Leahy Scale: Zero Standing balance comment: Pt required mod assist +2 for stability and balance with standing prior to ambulation.                            Cognition Arousal/Alertness: Awake/alert Behavior During Therapy: WFL for tasks  assessed/performed Overall Cognitive Status: Within Functional Limits for tasks assessed                                 General Comments: Pt A&O x 4      Exercises Total Joint Exercises Ankle Circles/Pumps: AROM;Both;10 reps;Supine;Strengthening Heel Slides: AAROM;Strengthening;Left;5 reps;Supine Hip ABduction/ADduction: AROM;Strengthening;Left;5  reps;Supine Straight Leg Raises: (attempted, but Pt could not raise at all even with AAROM.)    General Comments General comments (skin integrity, edema, etc.): A few dried blood droplets noted on pillow, Pt did know for sure where they came from.  I.V. port also noted to have dried blood.  Pt agreeable to session.      Pertinent Vitals/Pain Pain Assessment: 0-10 Pain Score: 0-No pain Pain Location: L leg(at rest, end of session) Pain Descriptors / Indicators: Aching Pain Intervention(s): Limited activity within patient's tolerance;Monitored during session    [place] expects to be discharged to:: Private residence                    Prior Function            PT Goals (current goals can now be found in the care plan section) Acute Rehab PT Goals Patient Stated Goal: I want to be able to walk again. PT Goal Formulation: With patient Time For Goal Achievement: 05/21/17 Potential to Achieve Goals: Fair Progress towards PT goals: Progressing toward goals    Frequency    Min 2X/week      PT Plan      Co-evaluation              AM-PAC PT "6 Clicks" Daily Activity  Outcome Measure  Difficulty turning over in bed (including adjusting bedclothes, sheets and blankets)?: A Lot Difficulty moving from lying on back to sitting on the side of the bed? : Unable Difficulty sitting down on and standing up from a chair with arms (e.g., wheelchair, bedside commode, etc,.)?: Unable Help needed moving to and from a bed to chair (including a wheelchair)?: Total Help needed walking in hospital room?: Total Help needed climbing 3-5 steps with a railing? : Total 6 Click Score: 7    End of Session Equipment Utilized During Treatment: Gait belt Activity Tolerance: Patient limited by fatigue Patient left: in chair;with call bell/phone within reach;with chair alarm set(B heels elevated by pillow) Nurse Communication: Mobility status PT Visit Diagnosis:  Unsteadiness on feet (R26.81);Other abnormalities of gait and mobility (R26.89);Muscle weakness (generalized) (M62.81);Difficulty in walking, not elsewhere classified (R26.2);Pain Pain - Right/Left: Left Pain - part of body: Leg     Time: 1015-1055 PT Time Calculation (min) (ACUTE ONLY): 40 min  Charges:                       G Codes:        Moss Berry Mondrian-Pardue, SPT 05/09/2017, 11:35 AM

## 2017-05-09 NOTE — Progress Notes (Signed)
[place] for Lovenox/Warfarin Indication: arterial clot  Allergies  Allergen Reactions  . Promethazine Anaphylaxis  . Prempro [Conj Estrog-Medroxyprogest Ace] Other (See Comments)    Pin in my breast  . Evista [Raloxifene] Other (See Comments)    Breast pain  . Zoloft [Sertraline Hcl] Other (See Comments)    Pt does not remember what type of reaction.    Patient Measurements: Height: 5\' 5"  (165.1 cm) Weight: 211 lb (95.7 kg) IBW/kg (Calculated) : 57 Heparin Dosing Weight: 73.1 kg  Vital Signs:    Labs: Recent Labs    05/07/17 0812 05/08/17 0447 05/09/17 0542  HGB 12.8 12.0 11.4*  HCT 39.4 36.1 34.7*  PLT 182 170 184  LABPROT 21.6* 21.1* 19.1*  INR 1.90 1.84 1.62  CREATININE 1.35* 1.29* 1.12*    Estimated Creatinine Clearance: 42 mL/min (A) (by C-G formula based on SCr of 1.12 mg/dL (H)).   Medical History: Past Medical History:  Diagnosis Date  . Arthritis   . Asthma   . Atrial fibrillation, unspecified   . Cancer (HCC)    rt side  . COPD (chronic obstructive pulmonary disease) ([place])   . Coronary artery disease   . Depression   . Dysrhythmia    A-fib  . Sleep apnea     Medications:  Patient takes warfarin 4mg  at bedtime daily. Her last dose was 3/8 @ 2000.   Assessment: 84 yo female presented to ED with complaints of swelling in left leg and known history of afib. Patient underwent embolectomy per vascular surgery on 3/9. Pharmacy has been consulted to dose lovenox and warfarin for afib.   DATE INR DOSE 3/9       1.26     4mg  3/10 1.71 5mg  3/11     1.90     4mg  3/12     1.84    5mg  3/13 1.62  Goal of Therapy:  Goal INR = 2-3 per [facility] cardiology note in Care Everywhere.  Plan:  Patient has resumed warfarin and bridge with lovenox. Change Lovneox to 95 mg q 12h (1mg /kg). Will order warfarin 5 mg daily for now.   Pharmacy will continue to follow and dose.   Ulice Dash, PharmD Clinical Pharmacist   05/09/2017 8:31 AM

## 2017-05-09 NOTE — Clinical Social Work Note (Signed)
CSW presented bed offers to patient's husband who was at bedside, he asked to have CSW contact patient's daughter Doralee Albino, [phone].  CSW contacted patient's daughter who would like patient to go to [facility].  CSW contacted [facility] and they can accept patient once she is medically ready for discharge and orders have been received.  Jones Broom. [place], MSW, [place]  05/09/2017 5:10 PM

## 2017-05-09 NOTE — Care Management Important Message (Signed)
Important Message  Patient Details  Name: Madeline Cunningham MRN: [id number] Date of Birth: [birth year]   Medicare Important Message Given:  Yes Signed IM notice given   Katrina Stack, RN 05/09/2017, 9:40 AM

## 2017-05-10 DIAGNOSIS — I743 Embolism and thrombosis of arteries of the lower extremities: Secondary | ICD-10-CM

## 2017-05-10 LAB — PROTIME-INR
INR: 1.46
Prothrombin Time: 17.6 seconds — ABNORMAL HIGH (ref 11.4–15.2)

## 2017-05-10 MED ORDER — WARFARIN SODIUM 7.5 MG PO TABS
7.5000 mg | ORAL_TABLET | Freq: Once | ORAL | Status: AC
  Administered 2017-05-10: 7.5 mg via ORAL
  Filled 2017-05-10: qty 1

## 2017-05-10 MED ORDER — DOCUSATE SODIUM 100 MG PO CAPS
100.0000 mg | ORAL_CAPSULE | Freq: Two times a day (BID) | ORAL | Status: DC
  Administered 2017-05-10 – 2017-05-11 (×2): 100 mg via ORAL
  Filled 2017-05-10 (×2): qty 1

## 2017-05-10 MED ORDER — POLYETHYLENE GLYCOL 3350 17 G PO PACK
17.0000 g | PACK | Freq: Every day | ORAL | Status: DC | PRN
  Administered 2017-05-10: 17 g via ORAL
  Filled 2017-05-10: qty 1

## 2017-05-10 NOTE — Clinical Social Work Note (Signed)
Clinical Social Work Assessment  Patient Details  Name: Madeline Cunningham MRN: [id number] Date of Birth: 1930/02/23  Date of referral:  05/09/17               Reason for consult:  Facility Placement                Permission sought to share information with:  Facility Sport and exercise psychologist, Family Supports Permission granted to share information::  Yes, Verbal Permission Granted  Name::     Cox,Tina L Daughter [phone], and Lynnelle, Mesmer [phone]     Agency::  SNF admissions  Relationship::     Contact Information:     Housing/Transportation Living arrangements for the past 2 months:  Single Family Home Source of Information:  Patient, Adult Children, Spouse Patient Interpreter Needed:  None Criminal Activity/Legal Involvement Pertinent to Current Situation/Hospitalization:  No - Comment as needed Significant Relationships:  Adult Children, Spouse Lives with:  Spouse Do you feel safe going back to the place where you live?  No Need for family participation in patient care:  Yes (Comment)  Care giving concerns:  Patient and family feel she needs some short term rehab before she is able to return back home.   Social Worker assessment / plan: Patient is an 84 year old female who is married, patient is alert and oriented x2.  Patient lives with her husband, and patient's daughter is involved in patient's care.  Patient has some confusion assessment completed by speaking with husband and daughter.  Per family report she has been to SNF for short term rehab, CSW explained process and how insurance will pay for stay.  CSW discussed with patient's family what to expect day of discharge from the hospital, and once she has completed her therapy at SNF.  Patient's family did not have any other questions and gave CSW permission to begin bed search in [place].  Employment status:  Retired Forensic scientist:  Medicare PT Recommendations:  [place] / Referral to community resources:  [place]  Patient/Family's Response to care:  Patient Family is in agreement to having patient go to SNF for short term rehab.  Patient/Family's Understanding of and Emotional Response to Diagnosis, Current Treatment, and Prognosis:  Patient's family is aware of current treatment plan and prognosis  Emotional Assessment Appearance:  Appears stated age Attitude/Demeanor/Rapport:    Affect (typically observed):  Appropriate, Calm Orientation:  Oriented to Place, Oriented to Self Alcohol / Substance use:  Not Applicable Psych involvement (Current and /or in the community):  No (Comment)  Discharge Needs  Concerns to be addressed:  Care Coordination, Lack of Support, Cognitive Concerns Readmission within the last 30 days:  No Current discharge risk:  Lack of support system Barriers to Discharge:  [place] Tour manager), Continued Medical Work up   Anell Barr 05/10/2017, 4:26 PM

## 2017-05-10 NOTE — Progress Notes (Signed)
[place] for Lovenox/Warfarin Indication: arterial clot  Allergies  Allergen Reactions  . Promethazine Anaphylaxis  . Prempro [Conj Estrog-Medroxyprogest Ace] Other (See Comments)    Pin in my breast  . Evista [Raloxifene] Other (See Comments)    Breast pain  . Zoloft [Sertraline Hcl] Other (See Comments)    Pt does not remember what type of reaction.    Patient Measurements: Height: 5\' 5"  (165.1 cm) Weight: 211 lb (95.7 kg) IBW/kg (Calculated) : 57 Heparin Dosing Weight: 73.1 kg  Vital Signs: Temp: 98 F (36.7 C) (03/14 0732) Temp Source: Oral (03/14 0732) BP: 135/66 (03/14 0732) Pulse Rate: 57 (03/14 0732)  Labs: Recent Labs    05/08/17 0447 05/09/17 0542 05/10/17 0457  HGB 12.0 11.4*  --   HCT 36.1 34.7*  --   PLT 170 184  --   LABPROT 21.1* 19.1* 17.6*  INR 1.84 1.62 1.46  CREATININE 1.29* 1.12*  --     Estimated Creatinine Clearance: 42 mL/min (A) (by C-G formula based on SCr of 1.12 mg/dL (H)).   Medical History: Past Medical History:  Diagnosis Date  . Arthritis   . Asthma   . Atrial fibrillation, unspecified   . Cancer (HCC)    rt side  . COPD (chronic obstructive pulmonary disease) ([employer])   . Coronary artery disease   . Depression   . Dysrhythmia    A-fib  . Sleep apnea     Medications:  Patient takes warfarin 4mg  at bedtime daily. Her last dose was 3/8 @ 2000.   Assessment: 84 yo female presented to ED with complaints of swelling in left leg and known history of afib. Patient underwent embolectomy per vascular surgery on 3/9. Pharmacy has been consulted to dose lovenox and warfarin for afib.   DATE INR DOSE 3/9       1.26     4mg  3/10 1.71 5mg  3/11     1.90     4mg  3/12     1.84    5mg  3/13 1.62 5mg  3/14 1.46  Goal of Therapy:  Goal INR = 2-3 per [facility] cardiology note in Care Everywhere.  Plan:  Patient has resumed warfarin and bridge with lovenox. Continue Lovneox 95 mg q 12h (1mg /kg). Will  order warfarin 7.5 mg once today with INR continuing to decrease.   Pharmacy will continue to follow and dose.   Ulice Dash, PharmD Clinical Pharmacist  05/10/2017 8:14 AM

## 2017-05-10 NOTE — Clinical Social Work Note (Signed)
Patient's [facility] is a manual screen, requested clincal notes have been sent to Pass[facility] awaiting Passar number.  CSW to continue to follow patient's progress throughout discharge planning.  Jones Broom. Norval Morton, MSW, [place]  05/10/2017 4:23 PM

## 2017-05-10 NOTE — Progress Notes (Signed)
[facility]  Daily Progress Note   Subjective  - 5 Days Post-Op  Denies leg pain, in bed this morning comfortable  Objective Vitals:   05/10/17 0433 05/10/17 0732 05/10/17 1230 05/10/17 1537  BP: 132/66 135/66 136/66 (!) 113/56  Pulse: 66 (!) 57 71 60  Resp: 18  18 18   Temp: 98 F (36.7 C) 98 F (36.7 C) 98.9 F (37.2 C) 97.8 F (36.6 C)  TempSrc: Oral Oral Oral Oral  SpO2: 95% 97% 93% 98%  Weight:      Height:        Intake/Output Summary (Last 24 hours) at 05/10/2017 1734 Last data filed at 05/10/2017 1242 Gross per 24 hour  Intake 0 ml  Output 1151 ml  Net -1151 ml    PULM  Normal effort , no use of accessory muscles CV  No JVD, RRR Abd      No distended, nontender VASC  left foot remains warm trace palpable posterior tibial pulse   Laboratory CBC    Component Value Date/Time   WBC 7.5 05/09/2017 0542   HGB 11.4 (L) 05/09/2017 0542   HGB 10.6 (L) 05/19/2013 0120   HCT 34.7 (L) 05/09/2017 0542   HCT 33.0 (L) 05/19/2013 0120   PLT 184 05/09/2017 0542   PLT 197 05/19/2013 0120    BMET    Component Value Date/Time   NA 141 05/09/2017 0542   NA 140 05/19/2013 0120   K 3.6 05/09/2017 0542   K 4.3 05/19/2013 0120   CL 110 05/09/2017 0542   CL 110 (H) 05/19/2013 0120   CO2 23 05/09/2017 0542   CO2 27 05/19/2013 0120   GLUCOSE 109 (H) 05/09/2017 0542   GLUCOSE 95 05/19/2013 0120   BUN 32 (H) 05/09/2017 0542   BUN 18 05/19/2013 0120   CREATININE 1.12 (H) 05/09/2017 0542   CREATININE 1.09 05/19/2013 0120   CALCIUM 8.4 (L) 05/09/2017 0542   CALCIUM 8.5 05/19/2013 0120   GFRNONAA 44 (L) 05/09/2017 0542   GFRNONAA 47 (L) 05/19/2013 0120   GFRAA 50 (L) 05/09/2017 0542   GFRAA 55 (L) 05/19/2013 0120    Assessment/Planning: POD #5s/p angiogram with embolectomy in specials   Continue coumadin, INR is down today compared to yesterdayyet again she is now 1.4.  Therefore, she cannot be discharged.  Hopefully tomorrow or the next  day her INR will be greater than 2 when she can be discharged.  Patient sitting up in chair again this afternoon physical therapy is working with her. I will hope to be able to discharge her in the next day or 2.   Hortencia Pilar  05/10/2017, 5:34 PM

## 2017-05-10 NOTE — Care Management (Signed)
INR continues to decrease . Coumadin dose will be increased today.

## 2017-05-10 NOTE — Plan of Care (Signed)
  Pain Managment: General experience of comfort will improve 05/10/2017 0150 - Progressing by Jeri Cos, RN   Cardiovascular: Ability to achieve and maintain adequate cardiovascular perfusion will improve 05/10/2017 0150 - Progressing by Jeri Cos, RN Vascular access site(s) Level 0-1 will be maintained 05/10/2017 0150 - Progressing by Jeri Cos, RN

## 2017-05-10 NOTE — Plan of Care (Signed)
Pt complaining of constipation, Marcelle Overlie, PA notified and orders for miralax and colace received. Progressing Education: Knowledge of General Education information will improve 05/10/2017 1634 - Progressing by Rolley Sims, RN Health Behavior/Discharge Planning: Ability to manage health-related needs will improve 05/10/2017 1634 - Progressing by Rolley Sims, RN Clinical Measurements: Ability to maintain clinical measurements within normal limits will improve 05/10/2017 1634 - Progressing by Rolley Sims, RN

## 2017-05-11 DIAGNOSIS — I743 Embolism and thrombosis of arteries of the lower extremities: Secondary | ICD-10-CM

## 2017-05-11 LAB — GLUCOSE, CAPILLARY
Glucose-Capillary: 94 mg/dL (ref 65–99)
Glucose-Capillary: 102 mg/dL — ABNORMAL HIGH (ref 65–99)

## 2017-05-11 LAB — PROTIME-INR
INR: 1.53
PROTHROMBIN TIME: 18.3 s — AB (ref 11.4–15.2)

## 2017-05-11 MED ORDER — LORAZEPAM 0.5 MG PO TABS: 0.5000 mg | ORAL_TABLET | Freq: Two times a day (BID) | ORAL | 0 refills | Status: AC | PRN

## 2017-05-11 MED ORDER — HYDROCODONE-ACETAMINOPHEN 5-325 MG PO TABS: 1.0000 | ORAL_TABLET | Freq: Four times a day (QID) | ORAL | 0 refills | Status: AC | PRN

## 2017-05-11 MED ORDER — WARFARIN SODIUM 5 MG PO TABS: 5.0000 mg | ORAL_TABLET | Freq: Every day | ORAL | 5 refills | Status: DC

## 2017-05-11 MED ORDER — WARFARIN SODIUM 7.5 MG PO TABS
7.5000 mg | ORAL_TABLET | Freq: Once | ORAL | Status: DC
  Filled 2017-05-11: qty 1

## 2017-05-11 MED ORDER — ENOXAPARIN SODIUM 100 MG/ML ~~LOC~~ SOLN: 1.0000 mg/kg | Freq: Two times a day (BID) | SUBCUTANEOUS | 0 refills | Status: DC

## 2017-05-11 NOTE — Progress Notes (Addendum)
Called report to facility nurse at this time. All questions answered. [place] for non-emergent EMS transport to [facility] SNF at this time. Wenda Low [facility]

## 2017-05-11 NOTE — Discharge Summary (Signed)
Madeline Cunningham    Discharge Summary    Patient ID:  Madeline Cunningham MRN: [id number] DOB/AGE: October 24, 1929 84 y.o.  Admit date: 05/05/2017 Discharge date: 05/11/2017 Date of Surgery: 05/05/2017 Surgeon: Surgeon(s): Lynett Brasil, Dolores Lory, MD  Admission Diagnosis: Arterial occlusion [I70.90] Left leg pain [M79.605] Sensation of cold in leg [R20.9]  Discharge Diagnoses:  Arterial occlusion [I70.90] Left leg pain [M79.605] Sensation of cold in leg [R20.9]  Secondary Diagnoses: Past Medical History:  Diagnosis Date  . Arthritis   . Asthma   . Atrial fibrillation, unspecified   . Cancer (HCC)    rt side  . COPD (chronic obstructive pulmonary disease) ([place])   . Coronary artery disease   . Depression   . Dysrhythmia    A-fib  . Sleep apnea     Procedure(s): LOWER EXTREMITY ANGIOGRAPHY LOWER EXTREMITY INTERVENTION  Discharged Condition: good  HPI:  Patient was admitted to [facility] from the emergency room on Saturday, March 9 with an acutely ischemic left lower extremity.  Workup including angiography was most consistent with embolization secondary to atrial fibrillation.  She had stopped taking her Coumadin approximately 1 week ago.  She was taken from the emergency room to the [facility] suite where embolectomy was performed percutaneously.  Flow was reestablished to the foot and the patient was maintained on heparin postoperatively.  It has taken approximately 6 days to achieve an INR that would allow discharge.  During her post embolectomy course there have been no problems or other issues that have developed.  She has been in the hospital since angiogram purely for angulation purposes.  Hospital Course:  Madeline Cunningham is a 84 y.o. female is S/P Left Procedure(s): LOWER EXTREMITY ANGIOGRAPHY LOWER EXTREMITY INTERVENTION Extubated: POD # 0 Physical exam: Right groin is clean dry and intact left foot is pink and warm trace palpable  posterior tibial pulse Post-op wounds healing well Pt. Ambulating, voiding and taking PO diet without difficulty. Pt pain controlled with PO pain meds. Labs as below Complications:none  Consults:  Treatment Team:  Katha Cabal, MD Saundra Shelling, MD  Significant Diagnostic Studies: CBC Lab Results  Component Value Date   WBC 7.5 05/09/2017   HGB 11.4 (L) 05/09/2017   HCT 34.7 (L) 05/09/2017   MCV 89.6 05/09/2017   PLT 184 05/09/2017    BMET    Component Value Date/Time   NA 141 05/09/2017 0542   NA 140 05/19/2013 0120   K 3.6 05/09/2017 0542   K 4.3 05/19/2013 0120   CL 110 05/09/2017 0542   CL 110 (H) 05/19/2013 0120   CO2 23 05/09/2017 0542   CO2 27 05/19/2013 0120   GLUCOSE 109 (H) 05/09/2017 0542   GLUCOSE 95 05/19/2013 0120   BUN 32 (H) 05/09/2017 0542   BUN 18 05/19/2013 0120   CREATININE 1.12 (H) 05/09/2017 0542   CREATININE 1.09 05/19/2013 0120   CALCIUM 8.4 (L) 05/09/2017 0542   CALCIUM 8.5 05/19/2013 0120   GFRNONAA 44 (L) 05/09/2017 0542   GFRNONAA 47 (L) 05/19/2013 0120   GFRAA 50 (L) 05/09/2017 0542   GFRAA 55 (L) 05/19/2013 0120   COAG Lab Results  Component Value Date   INR 1.53 05/11/2017   INR 1.46 05/10/2017   INR 1.62 05/09/2017     Disposition:  Discharge to :Skilled nursing facility Discharge Instructions    Call MD for:  redness, tenderness, or signs of infection (pain, swelling, bleeding, redness, odor or green/yellow discharge around incision  site)   Complete by:  As directed    Call MD for:  severe or increased pain, loss or decreased feeling  in affected limb(s)   Complete by:  As directed    Call MD for:  temperature >100.5   Complete by:  As directed    Discharge instructions   Complete by:  As directed    Patient is full weightbearing to both lower extremities no restrictions   Resume previous diet   Complete by:  As directed      Allergies as of 05/11/2017      Reactions   Promethazine Anaphylaxis   Prempro  [conj Estrog-medroxyprogest Ace] Other (See Comments)   Pin in my breast   Evista [raloxifene] Other (See Comments)   Breast pain   Zoloft [sertraline Hcl] Other (See Comments)   Pt does not remember what type of reaction.      Medication List    TAKE these medications   acetaminophen 500 MG tablet Commonly known as:  TYLENOL Take 500 mg by mouth daily as needed for mild pain.   busPIRone 10 MG tablet Commonly known as:  BUSPAR Take 10 mg by mouth 2 (two) times daily.   diltiazem 240 MG 24 hr capsule Commonly known as:  TIAZAC Take 120 mg by mouth daily with lunch.   enoxaparin 100 MG/ML injection Commonly known as:  LOVENOX Inject 0.95 mLs (95 mg total) into the skin every 12 (twelve) hours.   FLUoxetine 20 MG capsule Commonly known as:  PROZAC Take 20 mg by mouth every morning.   furosemide 20 MG tablet Commonly known as:  LASIX Take 40 mg by mouth daily.   gabapentin 100 MG capsule Commonly known as:  NEURONTIN Take 200 mg by mouth 3 (three) times daily.   HYDROcodone-acetaminophen 5-325 MG tablet Commonly known as:  NORCO Take 1 tablet by mouth every 6 (six) hours as needed for moderate pain.   LORazepam 0.5 MG tablet Commonly known as:  ATIVAN Take 1 tablet (0.5 mg total) by mouth every 12 (twelve) hours as needed for anxiety.   metoprolol 200 MG 24 hr tablet Commonly known as:  TOPROL-XL Take 200 mg by mouth at bedtime.   nitroGLYCERIN 0.4 MG SL tablet Commonly known as:  NITROSTAT Place 0.4 mg under the tongue every 5 (five) minutes as needed for chest pain.   pravastatin 80 MG tablet Commonly known as:  PRAVACHOL Take 80 mg by mouth at bedtime.   ropinirole 5 MG tablet Commonly known as:  REQUIP Take 5 mg by mouth at bedtime.   warfarin 5 MG tablet Commonly known as:  COUMADIN Take 1 tablet (5 mg total) by mouth at bedtime. What changed:    medication strength  how much to take      Verbal and written Discharge instructions given to  the patient. Wound care per Discharge AVS   Signed: Hortencia Pilar, MD  05/11/2017, 11:00 AM

## 2017-05-11 NOTE — Progress Notes (Signed)
[place] for Lovenox/Warfarin Indication: arterial clot  Allergies  Allergen Reactions  . Promethazine Anaphylaxis  . Prempro [Conj Estrog-Medroxyprogest Ace] Other (See Comments)    Pin in my breast  . Evista [Raloxifene] Other (See Comments)    Breast pain  . Zoloft [Sertraline Hcl] Other (See Comments)    Pt does not remember what type of reaction.    Patient Measurements: Height: 5\' 5"  (165.1 cm) Weight: 211 lb (95.7 kg) IBW/kg (Calculated) : 57 Heparin Dosing Weight: 73.1 kg  Vital Signs: Temp: 98 F (36.7 C) (03/15 0816) Temp Source: Oral (03/15 0440) BP: 114/42 (03/15 0816) Pulse Rate: 72 (03/15 0816)  Labs: Recent Labs    05/09/17 0542 05/10/17 0457 05/11/17 0512  HGB 11.4*  --   --   HCT 34.7*  --   --   PLT 184  --   --   LABPROT 19.1* 17.6* 18.3*  INR 1.62 1.46 1.53  CREATININE 1.12*  --   --     Estimated Creatinine Clearance: 41.3 mL/min (A) (by C-G formula based on SCr of 1.12 mg/dL (H)).   Medical History: Past Medical History:  Diagnosis Date  . Arthritis   . Asthma   . Atrial fibrillation, unspecified   . Cancer (HCC)    rt side  . COPD (chronic obstructive pulmonary disease) ([place])   . Coronary artery disease   . Depression   . Dysrhythmia    A-fib  . Sleep apnea     Medications:  Patient takes warfarin 4mg  at bedtime daily.  Assessment: 84 yo female presented to ED with complaints of swelling in left leg and known history of afib. Patient underwent embolectomy per vascular surgery on 3/9. Pharmacy has been consulted to dose lovenox and warfarin for afib.   DATE INR DOSE 3/9       1.26     4mg  3/10 1.71 5mg  3/11     1.90     4mg  3/12     1.84    5mg  3/13 1.62 5mg  3/14 1.46 7.5mg  3/15 1.53  Goal of Therapy:  Goal INR = 2-3 per [facility] cardiology note in Care Everywhere.  Plan:  Patient has resumed warfarin and bridge with lovenox. Continue Lovneox 95 mg q 12h (1mg /kg). Will order  warfarin 7.5 mg again today.   Pharmacy will continue to follow and dose.   Ulice Dash, PharmD Clinical Pharmacist  05/11/2017 8:19 AM

## 2017-05-11 NOTE — Discharge Instructions (Signed)

## 2017-05-14 DIAGNOSIS — I481 Persistent atrial fibrillation: Secondary | ICD-10-CM

## 2017-05-14 DIAGNOSIS — J439 Emphysema, unspecified: Secondary | ICD-10-CM | POA: Diagnosis not present

## 2017-05-14 DIAGNOSIS — I25119 Atherosclerotic heart disease of native coronary artery with unspecified angina pectoris: Secondary | ICD-10-CM

## 2017-05-14 DIAGNOSIS — F39 Unspecified mood [affective] disorder: Secondary | ICD-10-CM

## 2017-05-14 DIAGNOSIS — I739 Peripheral vascular disease, unspecified: Secondary | ICD-10-CM | POA: Diagnosis not present

## 2017-05-14 DIAGNOSIS — G301 Alzheimer's disease with late onset: Secondary | ICD-10-CM | POA: Diagnosis not present

## 2017-05-14 DIAGNOSIS — I7092 Chronic total occlusion of artery of the extremities: Secondary | ICD-10-CM | POA: Diagnosis not present

## 2017-05-14 NOTE — Clinical Social Work Note (Signed)
Late Entry: Patient discharged to [facility] on 3/15. Seth Bake at [facility] was aware prior to the discharge and discharge information was sent. Patient and husband were both aware and in agreement with discharge. Patient discharged via EMS.  Shela Leff MSW,LCSW [phone]

## 2017-05-14 NOTE — Clinical Social Work Placement (Signed)
   CLINICAL SOCIAL WORK PLACEMENT  NOTE  Date:  05/14/2017  Patient Details  Name: Madeline Cunningham MRN: [id number] Date of Birth: 1929/07/04  Clinical Social Work is seeking post-discharge placement for this patient at the [place] level of care (*CSW will initial, date and re-position this form in  chart as items are completed):  Yes   Patient/family provided with [place] Work Department's list of facilities offering this level of care within the geographic area requested by the patient (or if unable, by the patient's family).  Yes   Patient/family informed of their freedom to choose among providers that offer the needed level of care, that participate in Medicare, Medicaid or managed care program needed by the patient, have an available bed and are willing to accept the patient.  Yes   Patient/family informed of [place]'s ownership interest in [facility], as well as of the fact that they are under no obligation to receive care at these facilities.  PASRR submitted to EDS on 05/10/17     PASRR number received on 05/10/17     Existing PASRR number confirmed on       FL2 transmitted to all facilities in geographic area requested by pt/family on 05/10/17     FL2 transmitted to all facilities within larger geographic area on       Patient informed that his/her managed care company has contracts with or will negotiate with certain facilities, including the following:        Yes   Patient/family informed of bed offers received.  Patient chooses bed at [facility])     Physician recommends and patient chooses bed at [facility])    Patient to be transferred to [facility]) on 05/11/17.  Patient to be transferred to facility by (EMS)     Patient family notified on 05/14/17 of transfer.  Name of family member notified:  (Spouse: John Bommarito)     PHYSICIAN       Additional Comment:     _______________________________________________ Shela Leff, LCSW 05/14/2017, 8:50 AM

## 2017-05-28 DIAGNOSIS — G301 Alzheimer's disease with late onset: Secondary | ICD-10-CM | POA: Diagnosis not present

## 2017-05-28 DIAGNOSIS — I482 Chronic atrial fibrillation: Secondary | ICD-10-CM

## 2017-05-28 DIAGNOSIS — I25119 Atherosclerotic heart disease of native coronary artery with unspecified angina pectoris: Secondary | ICD-10-CM

## 2017-05-28 DIAGNOSIS — J439 Emphysema, unspecified: Secondary | ICD-10-CM | POA: Diagnosis not present

## 2017-05-28 DIAGNOSIS — I739 Peripheral vascular disease, unspecified: Secondary | ICD-10-CM

## 2017-05-28 DIAGNOSIS — N183 Chronic kidney disease, stage 3 (moderate): Secondary | ICD-10-CM

## 2017-05-28 DIAGNOSIS — F39 Unspecified mood [affective] disorder: Secondary | ICD-10-CM | POA: Diagnosis not present

## 2017-06-07 ENCOUNTER — Other Ambulatory Visit (INDEPENDENT_AMBULATORY_CARE_PROVIDER_SITE_OTHER): Payer: Self-pay | Admitting: Vascular Surgery

## 2017-06-07 ENCOUNTER — Encounter (INDEPENDENT_AMBULATORY_CARE_PROVIDER_SITE_OTHER): Payer: Medicare Other

## 2017-06-07 ENCOUNTER — Ambulatory Visit (INDEPENDENT_AMBULATORY_CARE_PROVIDER_SITE_OTHER): Payer: Medicare Other | Admitting: Vascular Surgery

## 2017-06-07 DIAGNOSIS — I739 Peripheral vascular disease, unspecified: Secondary | ICD-10-CM

## 2017-06-11 ENCOUNTER — Ambulatory Visit (INDEPENDENT_AMBULATORY_CARE_PROVIDER_SITE_OTHER): Payer: Medicare Other

## 2017-06-11 ENCOUNTER — Ambulatory Visit (INDEPENDENT_AMBULATORY_CARE_PROVIDER_SITE_OTHER): Payer: Medicare Other | Admitting: Vascular Surgery

## 2017-06-11 ENCOUNTER — Encounter (INDEPENDENT_AMBULATORY_CARE_PROVIDER_SITE_OTHER): Payer: Self-pay | Admitting: Vascular Surgery

## 2017-06-11 DIAGNOSIS — I10 Essential (primary) hypertension: Secondary | ICD-10-CM

## 2017-06-11 DIAGNOSIS — I4891 Unspecified atrial fibrillation: Secondary | ICD-10-CM

## 2017-06-11 DIAGNOSIS — I739 Peripheral vascular disease, unspecified: Secondary | ICD-10-CM

## 2017-06-11 DIAGNOSIS — E782 Mixed hyperlipidemia: Secondary | ICD-10-CM | POA: Diagnosis not present

## 2017-06-11 DIAGNOSIS — I749 Embolism and thrombosis of unspecified artery: Secondary | ICD-10-CM | POA: Diagnosis not present

## 2017-06-17 ENCOUNTER — Encounter (INDEPENDENT_AMBULATORY_CARE_PROVIDER_SITE_OTHER): Payer: Self-pay | Admitting: Vascular Surgery

## 2017-06-17 DIAGNOSIS — I749 Embolism and thrombosis of unspecified artery: Secondary | ICD-10-CM | POA: Insufficient documentation

## 2017-06-17 DIAGNOSIS — I10 Essential (primary) hypertension: Secondary | ICD-10-CM | POA: Insufficient documentation

## 2017-06-17 DIAGNOSIS — E785 Hyperlipidemia, unspecified: Secondary | ICD-10-CM | POA: Insufficient documentation

## 2017-06-17 DIAGNOSIS — I4891 Unspecified atrial fibrillation: Secondary | ICD-10-CM | POA: Insufficient documentation

## 2017-06-17 NOTE — Progress Notes (Signed)
MRN : [id number]  Madeline Cunningham is a 84 y.o. (04-05-1929) female who presents with chief complaint of  Chief Complaint  Patient presents with  . Follow-up    3wk abi  .  History of Present Illness: The patient returns to the office for followup and review status post angiogram with intervention. The patient notes improvement in the lower extremity symptoms. No interval shortening of the patient's claudication distance or rest pain symptoms. Previous wounds have now healed.  No new ulcers or wounds have occurred since the last visit.  There have been no significant changes to the patient's overall health care.  The patient denies amaurosis fugax or recent TIA symptoms. There are no recent neurological changes noted. The patient denies history of DVT, PE or superficial thrombophlebitis. The patient denies recent episodes of angina or shortness of breath.   ABI's Rt=1.12 and Lt=1.13   Current Meds  Medication Sig  . acetaminophen (TYLENOL) 500 MG tablet Take 500 mg by mouth daily as needed for mild pain.  . busPIRone (BUSPAR) 10 MG tablet Take 10 mg by mouth 2 (two) times daily.  Marland Kitchen diltiazem (TIAZAC) 240 MG 24 hr capsule Take 120 mg by mouth daily with lunch.  Marland Kitchen ELIQUIS 2.5 MG TABS tablet   . FLUoxetine (PROZAC) 20 MG capsule Take 20 mg by mouth every morning.  . furosemide (LASIX) 20 MG tablet Take 40 mg by mouth daily.  Marland Kitchen gabapentin (NEURONTIN) 100 MG capsule Take 200 mg by mouth 3 (three) times daily.  Marland Kitchen HYDROcodone-acetaminophen (NORCO) 5-325 MG tablet Take 1 tablet by mouth every 6 (six) hours as needed for moderate pain.  Marland Kitchen LORazepam (ATIVAN) 0.5 MG tablet Take 1 tablet (0.5 mg total) by mouth every 12 (twelve) hours as needed for anxiety.  . metoprolol (TOPROL-XL) 200 MG 24 hr tablet Take 200 mg by mouth at bedtime.  . nitroGLYCERIN (NITROSTAT) 0.4 MG SL tablet Place 0.4 mg under the tongue every 5 (five) minutes as needed for chest pain.  . pravastatin (PRAVACHOL) 80 MG  tablet Take 80 mg by mouth at bedtime.  . ropinirole (REQUIP) 5 MG tablet Take 5 mg by mouth at bedtime.    Past Medical History:  Diagnosis Date  . Arthritis   . Asthma   . Atrial fibrillation, unspecified   . Cancer (HCC)    rt side  . COPD (chronic obstructive pulmonary disease) ([place])   . Coronary artery disease   . Depression   . Dysrhythmia    A-fib  . Sleep apnea     Past Surgical History:  Procedure Laterality Date  . APPENDECTOMY    . CARDIAC CATHETERIZATION    . CARPAL TUNNEL RELEASE Right 04/27/2015   Procedure: CARPAL TUNNEL RELEASE;  Surgeon: Hessie Knows, MD;  Location: [facility];  Service: Orthopedics;  Laterality: Right;  . CATARACT EXTRACTION W/ INTRAOCULAR LENS IMPLANT Right 03/25/2007  . CATARACT EXTRACTION W/ INTRAOCULAR LENS IMPLANT Left 2010  . FRACTURE SURGERY Left    ORIF wrist  . LOWER EXTREMITY ANGIOGRAPHY N/A 05/05/2017   Procedure: LOWER EXTREMITY ANGIOGRAPHY;  Surgeon: Katha Cabal, MD;  Location: [place] [facility];  Service: Cardiovascular;  Laterality: N/A;  . LOWER EXTREMITY INTERVENTION N/A 05/05/2017   Procedure: LOWER EXTREMITY INTERVENTION;  Surgeon: Katha Cabal, MD;  Location: [place] [facility];  Service: Cardiovascular;  Laterality: N/A;    Social History Social History   Tobacco Use  . Smoking status: Former Smoker    Last attempt  to quit: 04/20/1975    Years since quitting: 42.1  . Smokeless tobacco: Never Used  Substance Use Topics  . Alcohol use: No  . Drug use: No    Family History Family History  Problem Relation Age of Onset  . Varicose Veins Neg Hx   . Vision loss Neg Hx     Allergies  Allergen Reactions  . Promethazine Anaphylaxis  . Prempro [Conj Estrog-Medroxyprogest Ace] Other (See Comments)    Pin in my breast  . Evista [Raloxifene] Other (See Comments)    Breast pain  . Zoloft [Sertraline Hcl] Other (See Comments)    Pt does not remember what type of reaction.     REVIEW OF SYSTEMS  (Negative unless checked)  Constitutional: [] Weight loss  [] Fever  [] Chills Cardiac: [] Chest pain   [] Chest pressure   [] Palpitations   [] Shortness of breath when laying flat   [] Shortness of breath with exertion. Vascular:  [] Pain in legs with walking   [] Pain in legs at rest  [] History of DVT   [] Phlebitis   [] Swelling in legs   [] Varicose veins   [] Non-healing ulcers Pulmonary:   [] Uses home oxygen   [] Productive cough   [] Hemoptysis   [] Wheeze  [] COPD   [] Asthma Neurologic:  [] Dizziness   [] Seizures   [] History of stroke   [] History of TIA  [] Aphasia   [] Vissual changes   [] Weakness or numbness in arm   [] Weakness or numbness in leg Musculoskeletal:   [] Joint swelling   [] Joint pain   [] Low back pain Hematologic:  [] Easy bruising  [] Easy bleeding   [] Hypercoagulable state   [] Anemic Gastrointestinal:  [] Diarrhea   [] Vomiting  [] Gastroesophageal reflux/heartburn   [] Difficulty swallowing. Genitourinary:  [] Chronic kidney disease   [] Difficult urination  [] Frequent urination   [] Blood in urine Skin:  [] Rashes   [] Ulcers  Psychological:  [] History of anxiety   []  History of major depression.  Physical Examination  Vitals:   06/11/17 1448  BP: 118/62  Pulse: 76  Resp: 16  Height: 5\' 5"  (1.651 m)   Body mass index is 35.11 kg/m. Gen: WD/WN, debilitated in a wheelchair NAD Head: [place]/AT, No temporalis wasting.  Ear/Nose/Throat: Hearing grossly intact, nares w/o erythema or drainage Eyes: PER, EOMI, sclera nonicteric.  Neck: Supple, no large masses.   Pulmonary:  Good air movement, no audible wheezing bilaterally, no use of accessory muscles.  Cardiac: RRR, no JVD Vascular:  2-3+ edema of the ankles bilaterally Vessel Right Left  Radial Palpable Palpable  PT Palpable Palpable  DP Palpable Palpable  Gastrointestinal: Non-distended. No guarding/no peritoneal signs.  Musculoskeletal: M/S 5/5 throughout.  No deformity or atrophy.  Neurologic: CN 2-12 intact. Symmetrical.  Speech is  fluent. Motor exam as listed above. Psychiatric: Judgment intact, Mood & affect appropriate for pt's clinical situation. Dermatologic: mild venous rashes no ulcers noted.  No changes consistent with cellulitis. Lymph : No lichenification or skin changes of chronic lymphedema.  CBC Lab Results  Component Value Date   WBC 7.5 05/09/2017   HGB 11.4 (L) 05/09/2017   HCT 34.7 (L) 05/09/2017   MCV 89.6 05/09/2017   PLT 184 05/09/2017    BMET    Component Value Date/Time   NA 141 05/09/2017 0542   NA 140 05/19/2013 0120   K 3.6 05/09/2017 0542   K 4.3 05/19/2013 0120   CL 110 05/09/2017 0542   CL 110 (H) 05/19/2013 0120   CO2 23 05/09/2017 0542   CO2 27 05/19/2013 0120   GLUCOSE  109 (H) 05/09/2017 0542   GLUCOSE 95 05/19/2013 0120   BUN 32 (H) 05/09/2017 0542   BUN 18 05/19/2013 0120   CREATININE 1.12 (H) 05/09/2017 0542   CREATININE 1.09 05/19/2013 0120   CALCIUM 8.4 (L) 05/09/2017 0542   CALCIUM 8.5 05/19/2013 0120   GFRNONAA 44 (L) 05/09/2017 0542   GFRNONAA 47 (L) 05/19/2013 0120   GFRAA 50 (L) 05/09/2017 0542   GFRAA 55 (L) 05/19/2013 0120   CrCl cannot be calculated (Patient's most recent lab result is older than the maximum 21 days allowed.).  COAG Lab Results  Component Value Date   INR 1.53 05/11/2017   INR 1.46 05/10/2017   INR 1.62 05/09/2017    Radiology No results found.   Assessment/Plan 1. Arterial embolism (HCC) Recommend:  I do not find evidence of life style limiting vascular disease. The patient specifically denies life style limitation.  Embolization has been effectively treated and her ABI's are normal  Previous noninvasive studies including ABI's of the legs do not identify critical vascular problems.  The patient should continue walking and begin a more formal exercise program. The patient should continue his antiplatelet therapy and aggressive treatment of the lipid abnormalities.  The patient should begin wearing graduated compression  socks 15-20 mmHg strength to control her mild edema.  Patient will follow-up with me on a PRN basis   2. Atrial fibrillation, unspecified type ([place]) Continue antiarrhythmia medications as already ordered, these medications have been reviewed and there are no changes at this time.  Continue anticoagulation as ordered by Cardiology Service   3. Mixed hyperlipidemia Continue statin as ordered and reviewed, no changes at this time   4. Essential hypertension Continue antihypertensive medications as already ordered, these medications have been reviewed and there are no changes at this time.     Hortencia Pilar, MD  06/17/2017 2:27 PM

## 2017-06-27 DIAGNOSIS — E785 Hyperlipidemia, unspecified: Secondary | ICD-10-CM | POA: Diagnosis not present

## 2017-06-27 DIAGNOSIS — I4891 Unspecified atrial fibrillation: Secondary | ICD-10-CM

## 2017-06-27 DIAGNOSIS — I10 Essential (primary) hypertension: Secondary | ICD-10-CM | POA: Diagnosis not present

## 2017-06-27 DIAGNOSIS — N183 Chronic kidney disease, stage 3 (moderate): Secondary | ICD-10-CM

## 2017-06-27 DIAGNOSIS — G2581 Restless legs syndrome: Secondary | ICD-10-CM | POA: Diagnosis not present

## 2017-06-27 DIAGNOSIS — I25110 Atherosclerotic heart disease of native coronary artery with unstable angina pectoris: Secondary | ICD-10-CM

## 2017-06-27 DIAGNOSIS — J441 Chronic obstructive pulmonary disease with (acute) exacerbation: Secondary | ICD-10-CM | POA: Diagnosis not present

## 2017-06-27 DIAGNOSIS — G309 Alzheimer's disease, unspecified: Secondary | ICD-10-CM

## 2017-06-27 DIAGNOSIS — I739 Peripheral vascular disease, unspecified: Secondary | ICD-10-CM | POA: Diagnosis not present

## 2017-06-27 DIAGNOSIS — F39 Unspecified mood [affective] disorder: Secondary | ICD-10-CM | POA: Diagnosis not present

## 2017-06-27 DIAGNOSIS — M1990 Unspecified osteoarthritis, unspecified site: Secondary | ICD-10-CM | POA: Diagnosis not present

## 2017-07-30 DIAGNOSIS — I739 Peripheral vascular disease, unspecified: Secondary | ICD-10-CM

## 2017-07-30 DIAGNOSIS — F39 Unspecified mood [affective] disorder: Secondary | ICD-10-CM | POA: Diagnosis not present

## 2017-07-30 DIAGNOSIS — J439 Emphysema, unspecified: Secondary | ICD-10-CM | POA: Diagnosis not present

## 2017-07-30 DIAGNOSIS — I25119 Atherosclerotic heart disease of native coronary artery with unspecified angina pectoris: Secondary | ICD-10-CM

## 2017-07-30 DIAGNOSIS — I481 Persistent atrial fibrillation: Secondary | ICD-10-CM | POA: Diagnosis not present

## 2017-07-30 DIAGNOSIS — G301 Alzheimer's disease with late onset: Secondary | ICD-10-CM

## 2017-07-30 DIAGNOSIS — N183 Chronic kidney disease, stage 3 (moderate): Secondary | ICD-10-CM

## 2017-08-06 DIAGNOSIS — I5031 Acute diastolic (congestive) heart failure: Secondary | ICD-10-CM | POA: Diagnosis not present

## 2017-10-01 DIAGNOSIS — L03115 Cellulitis of right lower limb: Secondary | ICD-10-CM | POA: Diagnosis not present

## 2017-10-03 DIAGNOSIS — F39 Unspecified mood [affective] disorder: Secondary | ICD-10-CM

## 2017-10-03 DIAGNOSIS — G309 Alzheimer's disease, unspecified: Secondary | ICD-10-CM | POA: Diagnosis not present

## 2017-10-03 DIAGNOSIS — G2581 Restless legs syndrome: Secondary | ICD-10-CM

## 2017-10-03 DIAGNOSIS — E785 Hyperlipidemia, unspecified: Secondary | ICD-10-CM

## 2017-10-03 DIAGNOSIS — N183 Chronic kidney disease, stage 3 (moderate): Secondary | ICD-10-CM

## 2017-10-03 DIAGNOSIS — M1990 Unspecified osteoarthritis, unspecified site: Secondary | ICD-10-CM

## 2017-10-03 DIAGNOSIS — J449 Chronic obstructive pulmonary disease, unspecified: Secondary | ICD-10-CM

## 2017-10-03 DIAGNOSIS — I25110 Atherosclerotic heart disease of native coronary artery with unstable angina pectoris: Secondary | ICD-10-CM

## 2017-10-03 DIAGNOSIS — I10 Essential (primary) hypertension: Secondary | ICD-10-CM

## 2017-10-03 DIAGNOSIS — I739 Peripheral vascular disease, unspecified: Secondary | ICD-10-CM

## 2017-10-03 DIAGNOSIS — I4891 Unspecified atrial fibrillation: Secondary | ICD-10-CM

## 2017-10-13 ENCOUNTER — Emergency Department: Payer: Medicare Other

## 2017-10-13 ENCOUNTER — Inpatient Hospital Stay: Payer: Medicare Other

## 2017-10-13 ENCOUNTER — Inpatient Hospital Stay
Admission: EM | Admit: 2017-10-13 | Discharge: 2017-10-28 | DRG: 871 | Disposition: E | Payer: Medicare Other | Source: Skilled Nursing Facility | Attending: Internal Medicine | Admitting: Internal Medicine

## 2017-10-13 ENCOUNTER — Encounter: Payer: Self-pay | Admitting: Internal Medicine

## 2017-10-13 DIAGNOSIS — Z86718 Personal history of other venous thrombosis and embolism: Secondary | ICD-10-CM | POA: Diagnosis not present

## 2017-10-13 DIAGNOSIS — I129 Hypertensive chronic kidney disease with stage 1 through stage 4 chronic kidney disease, or unspecified chronic kidney disease: Secondary | ICD-10-CM | POA: Diagnosis present

## 2017-10-13 DIAGNOSIS — G4733 Obstructive sleep apnea (adult) (pediatric): Secondary | ICD-10-CM | POA: Diagnosis present

## 2017-10-13 DIAGNOSIS — E871 Hypo-osmolality and hyponatremia: Secondary | ICD-10-CM | POA: Diagnosis present

## 2017-10-13 DIAGNOSIS — R443 Hallucinations, unspecified: Secondary | ICD-10-CM | POA: Diagnosis present

## 2017-10-13 DIAGNOSIS — N183 Chronic kidney disease, stage 3 (moderate): Secondary | ICD-10-CM | POA: Diagnosis present

## 2017-10-13 DIAGNOSIS — K117 Disturbances of salivary secretion: Secondary | ICD-10-CM

## 2017-10-13 DIAGNOSIS — R0600 Dyspnea, unspecified: Secondary | ICD-10-CM

## 2017-10-13 DIAGNOSIS — Z7901 Long term (current) use of anticoagulants: Secondary | ICD-10-CM | POA: Diagnosis not present

## 2017-10-13 DIAGNOSIS — F0390 Unspecified dementia without behavioral disturbance: Secondary | ICD-10-CM

## 2017-10-13 DIAGNOSIS — R4182 Altered mental status, unspecified: Secondary | ICD-10-CM

## 2017-10-13 DIAGNOSIS — R579 Shock, unspecified: Secondary | ICD-10-CM

## 2017-10-13 DIAGNOSIS — G9340 Encephalopathy, unspecified: Secondary | ICD-10-CM | POA: Diagnosis present

## 2017-10-13 DIAGNOSIS — Z6841 Body Mass Index (BMI) 40.0 and over, adult: Secondary | ICD-10-CM

## 2017-10-13 DIAGNOSIS — J449 Chronic obstructive pulmonary disease, unspecified: Secondary | ICD-10-CM | POA: Diagnosis present

## 2017-10-13 DIAGNOSIS — I2510 Atherosclerotic heart disease of native coronary artery without angina pectoris: Secondary | ICD-10-CM | POA: Diagnosis present

## 2017-10-13 DIAGNOSIS — R601 Generalized edema: Secondary | ICD-10-CM | POA: Diagnosis present

## 2017-10-13 DIAGNOSIS — A419 Sepsis, unspecified organism: Principal | ICD-10-CM | POA: Diagnosis present

## 2017-10-13 DIAGNOSIS — Z515 Encounter for palliative care: Secondary | ICD-10-CM | POA: Diagnosis not present

## 2017-10-13 DIAGNOSIS — R680 Hypothermia, not associated with low environmental temperature: Secondary | ICD-10-CM | POA: Diagnosis present

## 2017-10-13 DIAGNOSIS — Z8249 Family history of ischemic heart disease and other diseases of the circulatory system: Secondary | ICD-10-CM

## 2017-10-13 DIAGNOSIS — R001 Bradycardia, unspecified: Secondary | ICD-10-CM

## 2017-10-13 DIAGNOSIS — Z888 Allergy status to other drugs, medicaments and biological substances status: Secondary | ICD-10-CM

## 2017-10-13 DIAGNOSIS — Z66 Do not resuscitate: Secondary | ICD-10-CM | POA: Diagnosis present

## 2017-10-13 DIAGNOSIS — Z823 Family history of stroke: Secondary | ICD-10-CM

## 2017-10-13 DIAGNOSIS — R627 Adult failure to thrive: Secondary | ICD-10-CM | POA: Diagnosis present

## 2017-10-13 DIAGNOSIS — N170 Acute kidney failure with tubular necrosis: Secondary | ICD-10-CM | POA: Diagnosis present

## 2017-10-13 DIAGNOSIS — R109 Unspecified abdominal pain: Secondary | ICD-10-CM

## 2017-10-13 DIAGNOSIS — I482 Chronic atrial fibrillation: Secondary | ICD-10-CM | POA: Diagnosis present

## 2017-10-13 DIAGNOSIS — N179 Acute kidney failure, unspecified: Secondary | ICD-10-CM

## 2017-10-13 DIAGNOSIS — Z87891 Personal history of nicotine dependence: Secondary | ICD-10-CM

## 2017-10-13 LAB — PROTIME-INR
INR: 1.33
PROTHROMBIN TIME: 16.4 s — AB (ref 11.4–15.2)

## 2017-10-13 LAB — URINALYSIS, ROUTINE W REFLEX MICROSCOPIC
Bilirubin Urine: NEGATIVE
Glucose, UA: NEGATIVE mg/dL
HGB URINE DIPSTICK: NEGATIVE
Ketones, ur: NEGATIVE mg/dL
LEUKOCYTES UA: NEGATIVE
Nitrite: NEGATIVE
PROTEIN: NEGATIVE mg/dL
SPECIFIC GRAVITY, URINE: 1.013 (ref 1.005–1.030)
pH: 5 (ref 5.0–8.0)

## 2017-10-13 LAB — PHOSPHORUS: PHOSPHORUS: 6.9 mg/dL — AB (ref 2.5–4.6)

## 2017-10-13 LAB — COMPREHENSIVE METABOLIC PANEL
ALBUMIN: 2.8 g/dL — AB (ref 3.5–5.0)
ALT: 23 U/L (ref 0–44)
ALT: 24 U/L (ref 0–44)
ANION GAP: 10 (ref 5–15)
AST: 31 U/L (ref 15–41)
AST: 30 U/L (ref 15–41)
Albumin: 2.8 g/dL — ABNORMAL LOW (ref 3.5–5.0)
Alkaline Phosphatase: 166 U/L — ABNORMAL HIGH (ref 38–126)
Alkaline Phosphatase: 181 U/L — ABNORMAL HIGH (ref 38–126)
Anion gap: 12 (ref 5–15)
BILIRUBIN TOTAL: 1.2 mg/dL (ref 0.3–1.2)
BUN: 112 mg/dL — AB (ref 8–23)
BUN: 114 mg/dL — ABNORMAL HIGH (ref 8–23)
CHLORIDE: 99 mmol/L (ref 98–111)
CO2: 21 mmol/L — ABNORMAL LOW (ref 22–32)
CO2: 18 mmol/L — AB (ref 22–32)
CREATININE: 2.92 mg/dL — AB (ref 0.44–1.00)
Calcium: 8.4 mg/dL — ABNORMAL LOW (ref 8.9–10.3)
Calcium: 8.2 mg/dL — ABNORMAL LOW (ref 8.9–10.3)
Chloride: 104 mmol/L (ref 98–111)
Creatinine, Ser: 2.71 mg/dL — ABNORMAL HIGH (ref 0.44–1.00)
GFR calc Af Amer: 17 mL/min — ABNORMAL LOW (ref >60)
GFR calc non Af Amer: 15 mL/min — ABNORMAL LOW (ref >60)
GFR, EST AFRICAN AMERICAN: 16 mL/min — AB (ref >60)
GFR, EST NON AFRICAN AMERICAN: 14 mL/min — AB (ref >60)
GLUCOSE: 151 mg/dL — AB (ref 70–99)
Glucose, Bld: 126 mg/dL — ABNORMAL HIGH (ref 70–99)
POTASSIUM: 4.6 mmol/L (ref 3.5–5.1)
Potassium: 4.9 mmol/L (ref 3.5–5.1)
SODIUM: 132 mmol/L — AB (ref 135–145)
Sodium: 132 mmol/L — ABNORMAL LOW (ref 135–145)
TOTAL PROTEIN: 6.5 g/dL (ref 6.5–8.1)
TOTAL PROTEIN: 6.9 g/dL (ref 6.5–8.1)
Total Bilirubin: 1.2 mg/dL (ref 0.3–1.2)

## 2017-10-13 LAB — CBC WITH DIFFERENTIAL/PLATELET
BASOS ABS: 0.1 10*3/uL (ref 0–0.1)
Basophils Relative: 1 %
EOS ABS: 0.1 10*3/uL (ref 0–0.7)
Eosinophils Relative: 2 %
HCT: 36.4 % (ref 35.0–47.0)
HEMOGLOBIN: 11.8 g/dL — AB (ref 12.0–16.0)
LYMPHS ABS: 1.2 10*3/uL (ref 1.0–3.6)
Lymphocytes Relative: 25 %
MCH: 25.3 pg — AB (ref 26.0–34.0)
MCHC: 32.4 g/dL (ref 32.0–36.0)
MCV: 78 fL — ABNORMAL LOW (ref 80.0–100.0)
Monocytes Absolute: 0.6 10*3/uL (ref 0.2–0.9)
Monocytes Relative: 13 %
NEUTROS PCT: 59 %
Neutro Abs: 2.9 10*3/uL (ref 1.4–6.5)
PLATELETS: 144 10*3/uL — AB (ref 150–440)
RBC: 4.67 MIL/uL (ref 3.80–5.20)
RDW: 20.2 % — ABNORMAL HIGH (ref 11.5–14.5)
WBC: 4.9 10*3/uL (ref 3.6–11.0)

## 2017-10-13 LAB — PROCALCITONIN: PROCALCITONIN: 0.2 ng/mL

## 2017-10-13 LAB — LACTIC ACID, PLASMA
LACTIC ACID, VENOUS: 2 mmol/L — AB (ref 0.5–1.9)
Lactic Acid, Venous: 1.4 mmol/L (ref 0.5–1.9)

## 2017-10-13 LAB — GLUCOSE, CAPILLARY: GLUCOSE-CAPILLARY: 108 mg/dL — AB (ref 70–99)

## 2017-10-13 LAB — TSH: TSH: 2.963 u[IU]/mL (ref 0.350–4.500)

## 2017-10-13 LAB — LIPASE, BLOOD: LIPASE: 126 U/L — AB (ref 11–51)

## 2017-10-13 LAB — BRAIN NATRIURETIC PEPTIDE: B Natriuretic Peptide: 651 pg/mL — ABNORMAL HIGH (ref 0.0–100.0)

## 2017-10-13 LAB — TROPONIN I: Troponin I: <0.03 ng/mL (ref <0.03)

## 2017-10-13 LAB — MAGNESIUM: Magnesium: 3.1 mg/dL — ABNORMAL HIGH (ref 1.7–2.4)

## 2017-10-13 MED ORDER — SODIUM CHLORIDE 0.9 % IV SOLN
1.0000 g | Freq: Once | INTRAVENOUS | Status: AC
  Administered 2017-10-13: 1 g via INTRAVENOUS
  Filled 2017-10-13: qty 10

## 2017-10-13 MED ORDER — SODIUM CHLORIDE 0.9 % IV BOLUS (SEPSIS)
1000.0000 mL | Freq: Once | INTRAVENOUS | Status: AC
  Administered 2017-10-13: 1000 mL via INTRAVENOUS

## 2017-10-13 MED ORDER — ACETAMINOPHEN 650 MG RE SUPP: 650.0000 mg | Freq: Four times a day (QID) | RECTAL | Status: DC | PRN

## 2017-10-13 MED ORDER — ONDANSETRON HCL 4 MG PO TABS: 4.0000 mg | ORAL_TABLET | Freq: Four times a day (QID) | ORAL | Status: DC | PRN

## 2017-10-13 MED ORDER — ATROPINE SULFATE 1 MG/10ML IJ SOSY
1.0000 mg | PREFILLED_SYRINGE | Freq: Once | INTRAMUSCULAR | Status: AC
  Administered 2017-10-13: 1 mg via INTRAVENOUS

## 2017-10-13 MED ORDER — SODIUM CHLORIDE 0.9 % IV BOLUS
1000.0000 mL | Freq: Once | INTRAVENOUS | Status: AC
  Administered 2017-10-13: 1000 mL via INTRAVENOUS

## 2017-10-13 MED ORDER — BUSPIRONE HCL 10 MG PO TABS
10.0000 mg | ORAL_TABLET | Freq: Two times a day (BID) | ORAL | Status: DC
  Administered 2017-10-15 – 2017-10-16 (×4): 10 mg via ORAL
  Filled 2017-10-13 (×8): qty 1

## 2017-10-13 MED ORDER — VANCOMYCIN HCL IN DEXTROSE 1-5 GM/200ML-% IV SOLN
1000.0000 mg | INTRAVENOUS | Status: DC
  Administered 2017-10-14: 1000 mg via INTRAVENOUS
  Filled 2017-10-13: qty 200

## 2017-10-13 MED ORDER — METRONIDAZOLE IN NACL 5-0.79 MG/ML-% IV SOLN
500.0000 mg | Freq: Three times a day (TID) | INTRAVENOUS | Status: DC
  Administered 2017-10-13 – 2017-10-15 (×6): 500 mg via INTRAVENOUS
  Filled 2017-10-13 (×9): qty 100

## 2017-10-13 MED ORDER — ONDANSETRON HCL 4 MG/2ML IJ SOLN
4.0000 mg | Freq: Once | INTRAMUSCULAR | Status: AC
  Administered 2017-10-13: 4 mg via INTRAVENOUS
  Filled 2017-10-13: qty 2

## 2017-10-13 MED ORDER — LORAZEPAM 0.5 MG PO TABS: 0.5000 mg | ORAL_TABLET | Freq: Two times a day (BID) | ORAL | Status: DC | PRN

## 2017-10-13 MED ORDER — ATROPINE SULFATE 1 MG/ML IJ SOLN: 1.0000 mg | Freq: Once | INTRAMUSCULAR | Status: DC

## 2017-10-13 MED ORDER — APIXABAN 2.5 MG PO TABS
2.5000 mg | ORAL_TABLET | Freq: Two times a day (BID) | ORAL | Status: DC
  Administered 2017-10-15: 2.5 mg via ORAL
  Filled 2017-10-13: qty 1

## 2017-10-13 MED ORDER — SODIUM CHLORIDE 0.9 % IV SOLN
2.0000 g | INTRAVENOUS | Status: DC
  Administered 2017-10-14: 2 g via INTRAVENOUS
  Filled 2017-10-13 (×2): qty 2

## 2017-10-13 MED ORDER — SODIUM CHLORIDE 0.9 % IV SOLN: INTRAVENOUS | Status: DC

## 2017-10-13 MED ORDER — FLUOXETINE HCL 10 MG PO CAPS
10.0000 mg | ORAL_CAPSULE | ORAL | Status: DC
  Administered 2017-10-15 – 2017-10-16 (×2): 10 mg via ORAL
  Filled 2017-10-13 (×4): qty 1

## 2017-10-13 MED ORDER — ATROPINE SULFATE 1 MG/10ML IJ SOSY
0.5000 mg | PREFILLED_SYRINGE | Freq: Once | INTRAMUSCULAR | Status: AC
  Administered 2017-10-13: 0.5 mg via INTRAVENOUS

## 2017-10-13 MED ORDER — DOPAMINE-DEXTROSE 3.2-5 MG/ML-% IV SOLN
0.0000 ug/kg/min | INTRAVENOUS | Status: DC
  Administered 2017-10-14: 5 ug/kg/min via INTRAVENOUS
  Administered 2017-10-15: 2 ug/kg/min via INTRAVENOUS
  Administered 2017-10-15: 6 ug/kg/min via INTRAVENOUS
  Filled 2017-10-13 (×2): qty 250

## 2017-10-13 MED ORDER — ONDANSETRON HCL 4 MG/2ML IJ SOLN: 4.0000 mg | Freq: Four times a day (QID) | INTRAMUSCULAR | Status: DC | PRN

## 2017-10-13 MED ORDER — ACETAMINOPHEN 325 MG PO TABS: 650.0000 mg | ORAL_TABLET | Freq: Four times a day (QID) | ORAL | Status: DC | PRN

## 2017-10-13 MED ORDER — NOREPINEPHRINE 4 MG/250ML-% IV SOLN: 0.0000 ug/min | INTRAVENOUS | Status: DC

## 2017-10-13 MED ORDER — GLUCAGON HCL RDNA (DIAGNOSTIC) 1 MG IJ SOLR
5.0000 mg | Freq: Once | INTRAMUSCULAR | Status: DC
  Filled 2017-10-13 (×2): qty 5

## 2017-10-13 MED ORDER — VANCOMYCIN HCL IN DEXTROSE 1-5 GM/200ML-% IV SOLN
1000.0000 mg | Freq: Once | INTRAVENOUS | Status: AC
  Administered 2017-10-13: 1000 mg via INTRAVENOUS
  Filled 2017-10-13: qty 200

## 2017-10-13 MED ORDER — DOPAMINE-DEXTROSE 3.2-5 MG/ML-% IV SOLN
0.0000 ug/kg/min | INTRAVENOUS | Status: DC
  Administered 2017-10-13: 10 ug/kg/min via INTRAVENOUS

## 2017-10-13 MED ORDER — ATROPINE SULFATE 1 MG/10ML IJ SOSY
0.5000 mg | PREFILLED_SYRINGE | Freq: Once | INTRAMUSCULAR | Status: AC
Start: 2017-10-13 — End: 2017-10-13
  Administered 2017-10-13: 0.5 mg via INTRAVENOUS

## 2017-10-13 MED ORDER — SODIUM CHLORIDE 0.9 % IV SOLN
2.0000 g | Freq: Once | INTRAVENOUS | Status: AC
  Administered 2017-10-13: 2 g via INTRAVENOUS
  Filled 2017-10-13: qty 2

## 2017-10-13 MED ORDER — NOREPINEPHRINE 4 MG/250ML-% IV SOLN
10.0000 ug/min | Freq: Once | INTRAVENOUS | Status: AC
  Administered 2017-10-13: 10 ug/min via INTRAVENOUS

## 2017-10-13 NOTE — ED Notes (Signed)
Bair Hugger applied to patient with warm blankets on top.

## 2017-10-13 NOTE — Progress Notes (Signed)
CODE SEPSIS - PHARMACY COMMUNICATION  **Broad Spectrum Antibiotics should be administered within 1 hour of Sepsis diagnosis**  Time Code Sepsis Called/Page Received: @ 4975  Antibiotics Ordered: Cefepime                                      Vancomycin  Time of 1st antibiotic administration: @ 3005  Additional action taken by pharmacy: NA  If necessary, Name of Provider/Nurse Contacted: NA   Pernell Dupre, PharmD, BCPS Clinical Pharmacist 10/12/2017 4:38 PM

## 2017-10-13 NOTE — Consult Note (Signed)
PULMONARY / CRITICAL CARE MEDICINE   Name: Madeline Cunningham MRN: [id number] DOB: 12-31-1929    ADMISSION DATE:  10/27/2017   CONSULTATION DATE:  10/04/2017  REFERRING MD: Dr. Leslye Peer  Reason: Hypotension, bradycardia and anasarca  HISTORY OF PRESENT ILLNESS: 84 year old female with medical history as indicated below, on Eliquis for atrial fibrillation, who presented to the ED with acute change in mental status.  History is obtained from ED records and from patient's family as her mentation continues to wax and wane.  EMS was called because patient's mental status got worse over the past 2 weeks.  When EMS arrived, patient's heart rate was in the 30s and she was hypotensive.  She takes metoprolol 200 mg p.o. nightly and diltiazem 120 mg daily.  Her preliminary work-up in the ED showed a creatinine of 2.71, BNP of 651, and her chest x-ray showed mild interstitial pulmonary vascular congestion.  She was started on broad-spectrum antibiotics as well as dopamine.  She is being admitted to the ICU for further management  PAST MEDICAL HISTORY :  She  has a past medical history of Arthritis, Asthma, Atrial fibrillation, unspecified, Cancer ([place]), COPD (chronic obstructive pulmonary disease) ([place]), Coronary artery disease, Depression, Dysrhythmia, and Sleep apnea.  PAST SURGICAL HISTORY: She  has a past surgical history that includes Appendectomy; Fracture surgery (Left); Cardiac catheterization; Cataract extraction w/ intraocular lens implant (Right, 03/25/2007); Cataract extraction w/ intraocular lens implant (Left, 2010); Carpal tunnel release (Right, 04/27/2015); Lower Extremity Angiography (N/A, 05/05/2017); and LOWER EXTREMITY INTERVENTION (N/A, 05/05/2017).  Allergies  Allergen Reactions  . Promethazine Anaphylaxis  . Prempro [Conj Estrog-Medroxyprogest Ace] Other (See Comments)    Pin in my breast  . Evista [Raloxifene] Other (See Comments)    Breast pain  . Zoloft [Sertraline Hcl] Other (See  Comments)    Pt does not remember what type of reaction.    No current facility-administered medications on file prior to encounter.    Current Outpatient Medications on File Prior to Encounter  Medication Sig  . acetaminophen (TYLENOL) 500 MG tablet Take 500 mg by mouth daily as needed for mild pain.  . Amino Acids-Protein Hydrolys (FEEDING SUPPLEMENT, PRO-STAT SUGAR FREE 64,) LIQD Take 30 mLs by mouth every evening.  . busPIRone (BUSPAR) 10 MG tablet Take 10 mg by mouth 2 (two) times daily.  Marland Kitchen diltiazem (TIAZAC) 240 MG 24 hr capsule Take 120 mg by mouth daily with lunch.  Marland Kitchen ELIQUIS 2.5 MG TABS tablet Take 2.5 mg by mouth 2 (two) times daily.   Marland Kitchen FLUoxetine (PROZAC) 10 MG capsule Take 10 mg by mouth every morning.   . furosemide (LASIX) 40 MG tablet Take 1 tablet (40MG ) by mouth daily and 1 additional tablet (40MG ) daily as needed for weight gain over 240lbs  . gabapentin (NEURONTIN) 100 MG capsule Take 200 mg by mouth 3 (three) times daily.  Marland Kitchen HYDROcodone-acetaminophen (NORCO) 5-325 MG tablet Take 1 tablet by mouth every 6 (six) hours as needed for moderate pain.  Marland Kitchen LORazepam (ATIVAN) 0.5 MG tablet Take 1 tablet (0.5 mg total) by mouth every 12 (twelve) hours as needed for anxiety.  . metoprolol (TOPROL-XL) 200 MG 24 hr tablet Take 200 mg by mouth at bedtime.  . nitroGLYCERIN (NITROSTAT) 0.4 MG SL tablet Place 0.4 mg under the tongue every 5 (five) minutes as needed for chest pain.  . pravastatin (PRAVACHOL) 80 MG tablet Take 80 mg by mouth at bedtime.  . ropinirole (REQUIP) 5 MG tablet Take 5 mg by mouth  at bedtime.    FAMILY HISTORY:  Her family history includes CAD in her father; CVA in her father and mother. There is no history of Varicose Veins or Vision loss.  SOCIAL HISTORY: She  reports that she quit smoking about 42 years ago. She has never used smokeless tobacco. She reports that she does not drink alcohol or use drugs.  REVIEW OF SYSTEMS:   Unable to obtain due to  patient's mental status  SUBJECTIVE:   VITAL SIGNS: BP 108/62   Pulse (!) 57   Temp (!) 92.6 F (33.7 C)   Resp 10   SpO2 97%   HEMODYNAMICS:    VENTILATOR SETTINGS:    INTAKE / OUTPUT: I/O last 3 completed shifts: In: 2000 [IV Piggyback:2000] Out: -   PHYSICAL EXAMINATION: General: Appears a acutely ill looking Neuro: Somnolent, unable to follow commands, withdraws to pain HEENT: PERRLA, mild JVD, trachea midline Cardiovascular: Apical pulse irregular, S1-S2, no murmur regurg or gallop, +2 pulses bilaterally, +3 edema from bilateral lower extremities up to bilateral upper extremities-severe anasarca Lungs: Respirations mildly labored, bilateral breath sounds, diminished in the bases, fine crackles in the bases, no wheezing Abdomen: Obese, normal bowel sounds in all 4 quadrants Musculoskeletal: Positive range of motion in upper and lower extremities no joint swelling Skin: Warm, with diffuse swelling consistent with anasarca  LABS:  BMET Recent Labs  Lab 10/26/2017 1534  NA 132*  K 4.9  CL 99  CO2 21*  BUN 112*  CREATININE 2.92*  GLUCOSE 126*    Electrolytes Recent Labs  Lab 10/12/2017 1534  CALCIUM 8.4*    CBC Recent Labs  Lab 10/19/2017 1557  WBC 4.9  HGB 11.8*  HCT 36.4  PLT 144*    Coag's Recent Labs  Lab 10/19/2017 1534  INR 1.33    Sepsis Markers Recent Labs  Lab 10/19/2017 1557 10/01/2017 1610 09/28/2017 1749  LATICACIDVEN 2.0*  --  1.4  PROCALCITON  --  0.20  --     ABG No results for input(s): PHART, PCO2ART, PO2ART in the last 168 hours.  Liver Enzymes Recent Labs  Lab 10/15/2017 1534  AST 31  ALT 23  ALKPHOS 166*  BILITOT 1.2  ALBUMIN 2.8*    Cardiac Enzymes Recent Labs  Lab 10/05/2017 1534  TROPONINI <0.03    Glucose Recent Labs  Lab 10/21/2017 2027  GLUCAP 108*    Imaging Ct Head Wo Contrast  Result Date: 10/03/2017 CLINICAL DATA:  Altered mental status. EXAM: CT HEAD WITHOUT CONTRAST TECHNIQUE: Contiguous axial  images were obtained from the base of the skull through the vertex without intravenous contrast. COMPARISON:  April 01, 2009 FINDINGS: Brain: There is moderate frontal atrophy bilaterally. There is mild diffuse atrophy elsewhere. There is no intracranial mass, hemorrhage, extra-axial fluid collection, or midline shift. There is evidence of a prior focal infarct in the posterior right frontal lobe near the frontal-temporal junction. Elsewhere there is mild periventricular small vessel disease. There is no new gray-white compartment lesion. No acute infarct evident. Vascular: No evident hyperdense vessel. There is calcification in each carotid siphon region. Skull: The bony calvarium appears intact. There are foci of soft tissue air over the anterior and right face regions. Sinuses/Orbits: There is mucosal thickening in multiple ethmoid air cells. There is mild mucosal thickening in the left sphenoid sinus. Other visualized paranasal sinuses are clear. Visualized orbits appear symmetric bilaterally. Other: Visualized mastoid air cells are clear. There is debris in each external auditory canal. IMPRESSION: 1. Mild generalized  atrophy with moderate atrophy in the frontal lobe regions. 2. Prior posterior right frontal infarct, stable. Elsewhere there is mild periventricular small vessel disease. No acute infarct evident. No mass or hemorrhage. 3.  There are foci of arterial vascular calcification. 4. Areas of paranasal sinus disease. Probable cerumen in each external auditory canal. Electronically Signed   By: Lowella Grip III M.D.   On: 10/12/2017 17:20   Dg Chest Port 1 View  Result Date: 10/14/2017 CLINICAL DATA:  Altered mental status. EXAM: PORTABLE CHEST 1 VIEW COMPARISON:  05/18/2013 FINDINGS: Enlarged cardiac silhouette. Calcific atherosclerotic disease of the aorta. Mediastinal contours appear intact. Hypoinflation of the lungs. There is no evidence of focal airspace consolidation, pleural effusion or  pneumothorax. Mild prominence of the interstitium. Osseous structures are without acute abnormality. Soft tissues are grossly normal. Shock pads overlies the left thorax. Electronic apparatus seen in the lower thorax. IMPRESSION: Mild prominence of the interstitium may represent pulmonary vascular congestion or may be secondary to hypoinflation of the lungs. Electronically Signed   By: Fidela Salisbury M.D.   On: 10/10/2017 16:15     STUDIES:  Last echo 08/02/2015 showed an EF of greater than 55%  CULTURES: Blood cultures x2 Urine culture  ANTIBIOTICS: Vancomycin Cefepime  SIGNIFICANT EVENTS: 10/21/2017: Admitted  LINES/TUBES: Peripheral IVs  DISCUSSION: 84 year old female presenting with what appears to be cardiorenal syndrome, bradycardia, hypotension, and acute on chronic renal failure  ASSESSMENT  Acute encephalopathy Bradycardia Hypothermia Hypotension Anasarca Acute on chronic renal failure  PLAN Dopamine infusion and titrate to maintain mean arterial blood pressure 65 and above Follow-up cultures Continue antibiotics Trend procalcitonin and adjust antibiotics as needed Cardiology consult Follow-up blood cultures Trend creatinine N.p.o. until mental status improves GI and DVT prophylaxis  FAMILY  - Updates: Patient's family updated at bedside   Levert Heslop S. [facility] ANP-BC Pulmonary and Critical Care Medicine [facility] Pager [phone] or [phone]  NB: This document was prepared using Dragon voice recognition software and may include unintentional dictation errors.   09/27/2017, 8:53 PM

## 2017-10-13 NOTE — Progress Notes (Signed)
CODE SEPSIS - PHARMACY COMMUNICATION  **Broad Spectrum Antibiotics should be administered within 1 hour of Sepsis diagnosis**  Time Code Sepsis Called/Page Received: 1623  Antibiotics Ordered: vanc/cefepime  Time of 1st antibiotic administration: 1634  Additional action taken by pharmacy:   If necessary, Name of Provider/Nurse Contacted:     Tobie Lords ,PharmD Clinical Pharmacist  10/05/2017  9:40 PM

## 2017-10-13 NOTE — ED Notes (Signed)
Report given to [place], Therapist, sports.

## 2017-10-13 NOTE — ED Notes (Signed)
Date and time results received: 10/10/2017 1710  Test: Lactic Acid Critical Value: 2.0  Name of Provider Notified: [place]  Orders Received? Or Actions Taken?: will continue to monitor

## 2017-10-13 NOTE — ED Provider Notes (Addendum)
[facility] Emergency Department Provider Note ____________________________________________   First MD Initiated Contact with Patient 10/17/2017 1539     (approximate)  I have reviewed the triage vital signs and the nursing notes.   HISTORY  Chief Complaint Altered Mental Status   HPI Madeline Cunningham is a 84 y.o. female with a history of atrial fibrillation, coronary artery disease as well as an arterial embolism, on Eliquis, who is presenting with altered mental status.  Altered mental status appears to have happened over the past 24 hours.  However, the husband is at the outside door of the room and says that she has a waxing and waning altered mental status over the past 2 weeks.  EMS reported a glucose of 127.  No focal deficits reported.  Heart rate noted by EMS to be in the 30s to 40s.  Patient able to give limited answers.  Says that she is not having pain but unable to give a history of her present illness.   Past Medical History:  Diagnosis Date  . Arthritis   . Asthma   . Atrial fibrillation, unspecified   . Cancer (HCC)    rt side  . COPD (chronic obstructive pulmonary disease) ([place])   . Coronary artery disease   . Depression   . Dysrhythmia    A-fib  . Sleep apnea     Patient Active Problem List   Diagnosis Date Noted  . Arterial embolism ([place]) 06/17/2017  . A-fib ([place]) 06/17/2017  . Hyperlipidemia 06/17/2017  . Essential hypertension 06/17/2017  . Ischemic leg 05/05/2017  . 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 APPENDECTOMY    . CARDIAC CATHETERIZATION    . CARPAL TUNNEL RELEASE Right 04/27/2015   Procedure: CARPAL TUNNEL RELEASE;  Surgeon: Hessie Knows, MD;  Location: [facility] ORS;  Service: Orthopedics;  Laterality: Right;  . CATARACT EXTRACTION W/ INTRAOCULAR LENS IMPLANT Right 03/25/2007  . CATARACT EXTRACTION W/ INTRAOCULAR LENS IMPLANT Left  2010  . FRACTURE SURGERY Left    ORIF wrist  . LOWER EXTREMITY ANGIOGRAPHY N/A 05/05/2017   Procedure: LOWER EXTREMITY ANGIOGRAPHY;  Surgeon: Katha Cabal, MD;  Location: [facility];  Service: Cardiovascular;  Laterality: N/A;  . LOWER EXTREMITY INTERVENTION N/A 05/05/2017   Procedure: LOWER EXTREMITY INTERVENTION;  Surgeon: Katha Cabal, MD;  Location: [facility];  Service: Cardiovascular;  Laterality: N/A;    Prior to Admission medications   Medication Sig Start Date End Date Taking? Authorizing Provider  acetaminophen (TYLENOL) 500 MG tablet Take 500 mg by mouth daily as needed for mild pain.    [provider]  busPIRone (BUSPAR) 10 MG tablet Take 10 mg by mouth 2 (two) times daily.    [provider]  diltiazem (TIAZAC) 240 MG 24 hr capsule Take 120 mg by mouth daily with lunch.    [provider]  ELIQUIS 2.5 MG TABS tablet  05/28/17   [provider]  enoxaparin (LOVENOX) 100 MG/ML injection Inject 0.95 mLs (95 mg total) into the skin every 12 (twelve) hours. Patient not taking: Reported on 06/11/2017 05/11/17   Schnier, Dolores Lory, MD  FLUoxetine (PROZAC) 20 MG capsule Take 20 mg by mouth every morning.    [provider]  furosemide (LASIX) 20 MG tablet Take 40 mg by mouth daily.    [provider]  gabapentin (NEURONTIN) 100 MG capsule Take 200  mg by mouth 3 (three) times daily.    [provider]  HYDROcodone-acetaminophen (NORCO) 5-325 MG tablet Take 1 tablet by mouth every 6 (six) hours as needed for moderate pain. 05/11/17   Schnier, Dolores Lory, MD  LORazepam (ATIVAN) 0.5 MG tablet Take 1 tablet (0.5 mg total) by mouth every 12 (twelve) hours as needed for anxiety. 05/11/17   Schnier, Dolores Lory, MD  metoprolol (TOPROL-XL) 200 MG 24 hr tablet Take 200 mg by mouth at bedtime.    [provider]  nitroGLYCERIN (NITROSTAT) 0.4 MG SL tablet Place 0.4 mg under the tongue every 5 (five) minutes  as needed for chest pain.    [provider]  pravastatin (PRAVACHOL) 80 MG tablet Take 80 mg by mouth at bedtime.    [provider]  ropinirole (REQUIP) 5 MG tablet Take 5 mg by mouth at bedtime.    [provider]  warfarin (COUMADIN) 5 MG tablet Take 1 tablet (5 mg total) by mouth at bedtime. Patient not taking: Reported on 06/11/2017 05/11/17   Schnier, Dolores Lory, MD    Allergies Promethazine; Lyn [place]ings[place] estrog-medroxyprogest ace]; Evista [raloxifene]; and Zoloft [sertraline hcl]  Family History  Problem Relation Age of Onset  . Varicose Veins Neg Hx   . Vision loss Neg Hx     Social History Social History   Tobacco Use  . Smoking status: Former Smoker    Last attempt to quit: 04/20/1975    Years since quitting: 42.5  . Smokeless tobacco: Never Used  Substance Use Topics  . Alcohol use: No  . Drug use: No    Review of Systems  Patient oriented to name and able to follow commands but unable to give further history at this time.   ____________________________________________   PHYSICAL EXAM:  VITAL SIGNS: ED Triage Vitals  Enc Vitals Group     BP      Pulse      Resp      Temp      Temp src      SpO2      Weight      Height      Head Circumference      Peak Flow      Pain Score      Pain Loc      Pain Edu?      Excl. in [place]?     Constitutional: Alert and oriented to self only.  Following commands.  Opens eyes to voice. Eyes: Conjunctivae are normal.  Head: Atraumatic. Nose: No congestion/rhinnorhea. Mouth/Throat: Mucous membranes are moist.  Neck: No stridor.   Cardiovascular: The cardiac in the 30s and 40s with an irregularly irregular rhythm. Respiratory: Normal respiratory effort.  No retractions. Lungs CTAB. Gastrointestinal: Soft and nontender. No distention. No CVA tenderness. Musculoskeletal: Lateral lower extremity edema.  Weeping edema to the right thigh where there is some mild erythema with a foul odor.  No  induration.  Extremities are cool to touch.   Neurologic:  Normal speech and language. No gross focal neurologic deficits are appreciated. Skin:  Skin is warm, dry and intact. No rash noted.   ____________________________________________   LABS (all labs ordered are listed, but only abnormal results are displayed)  Labs Reviewed - No data to display ____________________________________________  EKG  ED ECG REPORT I, Doran Stabler, the attending physician, personally viewed and interpreted this ECG.   Date: 10/01/2017  EKG Time: 1546  Rate: 49  Rhythm: atrial fibrillation, rate 49  Axis: Normal  Intervals:none  ST&T Change: No ST segment elevation or depression.  No abnormal T wave inversion.  ____________________________________________  RADIOLOGY  Test x-ray with hyperinflation versus pulmonary edema.  No acute finding on the CT of the brain. ____________________________________________   PROCEDURES  Procedure(s) performed:   .Critical Care Performed by: Orbie Pyo, MD Authorized by: Orbie Pyo, MD   Critical care provider statement:    Critical care time (minutes):  45   Critical care time was exclusive of:  Separately billable procedures and treating other patients   Critical care was necessary to treat or prevent imminent or life-threatening deterioration of the following conditions:  Shock and cardiac failure   Critical care was time spent personally by me on the following activities:  Development of treatment plan with patient or surrogate, discussions with consultants, evaluation of patient's response to treatment, examination of patient, obtaining history from patient or surrogate, ordering and performing treatments and interventions, ordering and review of laboratory studies, ordering and review of radiographic studies, pulse oximetry, re-evaluation of patient's condition and review of old charts    Critical Care performed:      ____________________________________________   INITIAL IMPRESSION / [place] / ED COURSE  Pertinent labs & imaging results that were available during my care of the patient were reviewed by me and considered in my medical decision making (see chart for details).  Differential diagnosis includes, but is not limited to, alcohol, illicit or prescription medications, or other toxic ingestion; intracranial pathology such as stroke or intracerebral hemorrhage; fever or infectious causes including sepsis; hypoxemia and/or hypercarbia; uremia; trauma; endocrine related disorders such as diabetes, hypoglycemia, and thyroid-related diseases; hypertensive encephalopathy; etc. As part of my medical decision making, I reviewed the following data within the electronic MEDICAL RECORD NUMBER Notes from prior ED visits  ----------------------------------------- 4:28 PM on 10/02/2017 -----------------------------------------  Patient blood pressure initially in the 120s but quickly became hypotensive going down to the 60s at one point.  Given 1 mg of atropine followed by starting a Levophed drip.  Patient still responsive to voice and following commands.  Improvement with Levophed and atropine with heart rate now in the 50s and blood pressure systolically above 703.  Sepsis protocol ordered.  Patient being administered fluids as well as empiric antibiotics ordered.  I also discussed the critical condition of the patient with family who state that the patient does have a living will and that she would not want to be on prolonged ventilation/life support but would want all available options at this time.  They do not believe the patient would want any sort of life prolonging measures without the hope of recovery.  ----------------------------------------- 5:38 PM on 10/23/2017 -----------------------------------------  Patient at this time still with a heart rate in the 40s on 10 of levo fed.  We will  give an additional 0.5 mg of atropine because already appears to be slowing again.  However, the pressure is maintained above 100 and is now 101/58.  Bloods need to be redrawn because of the initial set being hemolyzed.  Possible deleterious interaction of diltiazem and metoprolol.  However, I am concerned that with the patient's decreased mentation that giving glucagon would cause vomiting and aspiration.  Patient given broad-spectrum antibiotics.  Possible cellulitis to the right lower extremity.  Also with hypothermia which could be from sepsis versus just poor perfusion.  Patient be admitted to the hospital.  Signed out to Dr. Marcille Blanco. ____________________________________________   FINAL CLINICAL IMPRESSION(S) /  ED DIAGNOSES  Altered mental status.  Shock.  Bradycardia.  NEW MEDICATIONS STARTED DURING THIS VISIT:  New Prescriptions   No medications on file     Note:  This document was prepared using Dragon voice recognition software and may include unintentional dictation errors.     Orbie Pyo, MD 10/12/2017 1739  Patient's heart rate noted to be back in the 30s.  Given additional dose of 0.5 mg of atropine.  Dr. Marcille Blanco aware and patient will now receive 5 mg of IV glucagon as it appears that the atropine is having a limited effect at this time.    Orbie Pyo, MD 10/12/2017 [phone]

## 2017-10-13 NOTE — Progress Notes (Signed)
Patient ID: Madeline Cunningham, female   DOB: [birth year], 84 y.o.   MRN: [id number]  ACP note  Patient unable to participate in conversation.  Husband and family present.  Diagnosis: Clinical sepsis with hypotension, hypothermia and bradycardia, acute kidney injury, chronic atrial fibrillation and history of DVT, dementia, sleep apnea, history of CAD, COPD.  CODE STATUS discussed and patient made a DO NOT RESUSCITATE.  Because of the patient's dementia she does not have a great quality of life and family decided to make her a DO NOT RESUSCITATE.  Plan.  Monitor closely in the CCU stepdown.  Try to reverse beta-blocker with glucagon and calcium.  Levophed and fluids for hypotension.  Empiric antibiotics until cultures are back.  Monitor kidney function closely.  Time spent on ACP discussion 25 minutes Dr. Loletha Grayer

## 2017-10-13 NOTE — ED Triage Notes (Signed)
Pt from [facility]. Sent out for altered mental status. Afib 46-60 with EMS.  BGL 136 18RR 95%RA.

## 2017-10-13 NOTE — Progress Notes (Signed)
Pharmacy Antibiotic Note  Madeline Cunningham is a 84 y.o. female admitted on 09/30/2017 with sepsis.  Pharmacy has been consulted for Vancomycin and Cefepime dosing.  Plan: Vancomycin 1g IV every 48 hours.  Goal trough 15-20 mcg/mL. Vancomycin trough prior to 4th dose, may need to check sooner if CrCl declines.  Cefepime 2g IV q24h  Weight: 210 lb 15.7 oz (95.7 kg)  Temp (24hrs), Avg:92.6 F (33.7 C), Min:92.1 F (33.4 C), Max:93.2 F (34 C)  Recent Labs  Lab 10/07/2017 1534 10/19/2017 1557 10/21/2017 1749  WBC  --  4.9  --   CREATININE 2.92*  --   --   LATICACIDVEN  --  2.0* 1.4    Estimated Creatinine Clearance: 15.8 mL/min (A) (by C-G formula based on SCr of 2.92 mg/dL (H)).    Allergies  Allergen Reactions  . Promethazine Anaphylaxis  . Prempro [Conj Estrog-Medroxyprogest Ace] Other (See Comments)    Pin in my breast  . Evista [Raloxifene] Other (See Comments)    Breast pain  . Zoloft [Sertraline Hcl] Other (See Comments)    Pt does not remember what type of reaction.    Antimicrobials this admission: Cefepime 8/17 >>  Vancomycin 8/17 >>  Metronidazole 8/17 >>  Thank you for allowing pharmacy to be a part of this patient's care.  Paulina Fusi, PharmD, BCPS 09/29/2017 9:40 PM

## 2017-10-13 NOTE — H&P (Signed)
[facility] [place] NAME: Madeline Cunningham    MR#:  [id number]  DATE OF BIRTH:  10/10/1929  DATE OF ADMISSION:  10/22/2017  PRIMARY CARE PHYSICIAN: Kirk Ruths, MD   REQUESTING/REFERRING PHYSICIAN: Dr Larae Grooms  CHIEF COMPLAINT:   Chief Complaint  Patient presents with  . Altered Mental Status    HISTORY OF PRESENT ILLNESS:  Madeline Cunningham  is a 84 y.o. female with a known history of dementia sent in for altered mental status, hypotension, bradycardia and hypothermia.  The patient is able to open eyes to stimulation but falls back asleep pretty easily.  Patient unable to give any history at this time.  History obtained from old chart and from husband.  Patient was initially a [facility] and then moved over to memory care since March.  She has had problems with water retention.  Mostly she is confined to the wheelchair.  Her memory has declined.  On Monday was their anniversary and she did not wake up all day.  On Tuesday she was better.  This a.m. again not doing well.  In the ER, she was found to be hypotensive, bradycardic and hypothermic.  Hospitalist services were contacted for further evaluation  PAST MEDICAL HISTORY:   Past Medical History:  Diagnosis Date  . Arthritis   . Asthma   . Atrial fibrillation, unspecified   . Cancer (HCC)    rt side  . COPD (chronic obstructive pulmonary disease) ([place])   . Coronary artery disease   . Depression   . Dysrhythmia    A-fib  . Sleep apnea     PAST SURGICAL HISTORY:   Past Surgical History:  Procedure Laterality Date  . APPENDECTOMY    . CARDIAC CATHETERIZATION    . CARPAL TUNNEL RELEASE Right 04/27/2015   Procedure: CARPAL TUNNEL RELEASE;  Surgeon: Hessie Knows, MD;  Location: [facility] ORS;  Service: Orthopedics;  Laterality: Right;  . CATARACT EXTRACTION W/ INTRAOCULAR LENS IMPLANT Right 03/25/2007  . CATARACT EXTRACTION W/ INTRAOCULAR LENS IMPLANT Left 2010  .  FRACTURE SURGERY Left    ORIF wrist  . LOWER EXTREMITY ANGIOGRAPHY N/A 05/05/2017   Procedure: LOWER EXTREMITY ANGIOGRAPHY;  Surgeon: Katha Cabal, MD;  Location: [facility];  Service: Cardiovascular;  Laterality: N/A;  . LOWER EXTREMITY INTERVENTION N/A 05/05/2017   Procedure: LOWER EXTREMITY INTERVENTION;  Surgeon: Katha Cabal, MD;  Location: [facility];  Service: Cardiovascular;  Laterality: N/A;    SOCIAL HISTORY:   Social History   Tobacco Use  . Smoking status: Former Smoker    Last attempt to quit: 04/20/1975    Years since quitting: 42.5  . Smokeless tobacco: Never Used  Substance Use Topics  . Alcohol use: No    FAMILY HISTORY:   Family History  Problem Relation Age of Onset  . CVA Mother   . CVA Father   . CAD Father   . Varicose Veins Neg Hx   . Vision loss Neg Hx     DRUG ALLERGIES:   Allergies  Allergen Reactions  . Promethazine Anaphylaxis  . Prempro [Conj Estrog-Medroxyprogest Ace] Other (See Comments)    Pin in my breast  . Evista [Raloxifene] Other (See Comments)    Breast pain  . Zoloft [Sertraline Hcl] Other (See Comments)    Pt does not remember what type of reaction.    REVIEW OF SYSTEMS:   Patient unable to give review of systems at  this time secondary to acute medical condition.   MEDICATIONS AT HOME:   Prior to Admission medications   Medication Sig Start Date End Date Taking? Authorizing Provider  acetaminophen (TYLENOL) 500 MG tablet Take 500 mg by mouth daily as needed for mild pain.   Yes [provider]  Amino Acids-Protein Hydrolys (FEEDING SUPPLEMENT, PRO-STAT SUGAR FREE 64,) LIQD Take 30 mLs by mouth every evening.   Yes [provider]  busPIRone (BUSPAR) 10 MG tablet Take 10 mg by mouth 2 (two) times daily.   Yes [provider]  diltiazem (TIAZAC) 240 MG 24 hr capsule Take 120 mg by mouth daily with lunch.   Yes [provider]  ELIQUIS 2.5 MG TABS tablet Take  2.5 mg by mouth 2 (two) times daily.  05/28/17  Yes [provider]  FLUoxetine (PROZAC) 10 MG capsule Take 10 mg by mouth every morning.    Yes [provider]  furosemide (LASIX) 40 MG tablet Take 1 tablet (40MG ) by mouth daily and 1 additional tablet (40MG ) daily as needed for weight gain over 240lbs   Yes [provider]  gabapentin (NEURONTIN) 100 MG capsule Take 200 mg by mouth 3 (three) times daily.   Yes [provider]  HYDROcodone-acetaminophen (NORCO) 5-325 MG tablet Take 1 tablet by mouth every 6 (six) hours as needed for moderate pain. 05/11/17  Yes Schnier, Dolores Lory, MD  LORazepam (ATIVAN) 0.5 MG tablet Take 1 tablet (0.5 mg total) by mouth every 12 (twelve) hours as needed for anxiety. 05/11/17  Yes Schnier, Dolores Lory, MD  metoprolol (TOPROL-XL) 200 MG 24 hr tablet Take 200 mg by mouth at bedtime.   Yes [provider]  nitroGLYCERIN (NITROSTAT) 0.4 MG SL tablet Place 0.4 mg under the tongue every 5 (five) minutes as needed for chest pain.   Yes [provider]  pravastatin (PRAVACHOL) 80 MG tablet Take 80 mg by mouth at bedtime.   Yes [provider]  ropinirole (REQUIP) 5 MG tablet Take 5 mg by mouth at bedtime.   Yes [provider]      VITAL SIGNS:  Blood pressure 120/62, pulse (!) 51, temperature (!) 92.9 F (33.8 C), resp. rate 20, SpO2 95 %.  PHYSICAL EXAMINATION:  GENERAL:  84 y.o.-year-old patient lying in the bed.  EYES: Pupils equal, round, reactive to light but sluggish. No scleral icterus.  HEENT: Head atraumatic, normocephalic.  Unable to look into mouth at this time NECK:  Supple, no jugular venous distention. No thyroid enlargement, no tenderness.  LUNGS: Decreased breath sounds bilaterally, no wheezing, rales,rhonchi or crepitation. No use of accessory muscles of respiration.  CARDIOVASCULAR: S1, S2 bradycardic. No murmurs, rubs, or gallops.  ABDOMEN: Soft, nontender, nondistended. Bowel  sounds present. No organomegaly or mass.  EXTREMITIES: 3+ pedal edema, no cyanosis, or clubbing.  NEUROLOGIC: Patient is lethargic and unable to assess neurological status well.  Babinski negative. PSYCHIATRIC: The patient is lethargic.  SKIN: No rash, lesion, or ulcer.   LABORATORY PANEL:   CBC Recent Labs  Lab 10/19/2017 1557  WBC 4.9  HGB 11.8*  HCT 36.4  PLT 144*   ------------------------------------------------------------------------------------------------------------------  Chemistries  Recent Labs  Lab 09/29/2017 1534  NA 132*  K 4.9  CL 99  CO2 21*  GLUCOSE 126*  BUN 112*  CREATININE 2.92*  CALCIUM 8.4*  AST 31  ALT 23  ALKPHOS 166*  BILITOT 1.2   ------------------------------------------------------------------------------------------------------------------  Cardiac Enzymes Recent Labs  Lab 09/29/2017 1534  TROPONINI <  0.03   ------------------------------------------------------------------------------------------------------------------  RADIOLOGY:  Ct Head Wo Contrast  Result Date: 10/10/2017 CLINICAL DATA:  Altered mental status. EXAM: CT HEAD WITHOUT CONTRAST TECHNIQUE: Contiguous axial images were obtained from the base of the skull through the vertex without intravenous contrast. COMPARISON:  April 01, 2009 FINDINGS: Brain: There is moderate frontal atrophy bilaterally. There is mild diffuse atrophy elsewhere. There is no intracranial mass, hemorrhage, extra-axial fluid collection, or midline shift. There is evidence of a prior focal infarct in the posterior right frontal lobe near the frontal-temporal junction. Elsewhere there is mild periventricular small vessel disease. There is no new gray-white compartment lesion. No acute infarct evident. Vascular: No evident hyperdense vessel. There is calcification in each carotid siphon region. Skull: The bony calvarium appears intact. There are foci of soft tissue air over the anterior and right face regions.  Sinuses/Orbits: There is mucosal thickening in multiple ethmoid air cells. There is mild mucosal thickening in the left sphenoid sinus. Other visualized paranasal sinuses are clear. Visualized orbits appear symmetric bilaterally. Other: Visualized mastoid air cells are clear. There is debris in each external auditory canal. IMPRESSION: 1. Mild generalized atrophy with moderate atrophy in the frontal lobe regions. 2. Prior posterior right frontal infarct, stable. Elsewhere there is mild periventricular small vessel disease. No acute infarct evident. No mass or hemorrhage. 3.  There are foci of arterial vascular calcification. 4. Areas of paranasal sinus disease. Probable cerumen in each external auditory canal. Electronically Signed   By: Lowella Grip III M.D.   On: 10/16/2017 17:20   Dg Chest Port 1 View  Result Date: 10/09/2017 CLINICAL DATA:  Altered mental status. EXAM: PORTABLE CHEST 1 VIEW COMPARISON:  05/18/2013 FINDINGS: Enlarged cardiac silhouette. Calcific atherosclerotic disease of the aorta. Mediastinal contours appear intact. Hypoinflation of the lungs. There is no evidence of focal airspace consolidation, pleural effusion or pneumothorax. Mild prominence of the interstitium. Osseous structures are without acute abnormality. Soft tissues are grossly normal. Shock pads overlies the left thorax. Electronic apparatus seen in the lower thorax. IMPRESSION: Mild prominence of the interstitium may represent pulmonary vascular congestion or may be secondary to hypoinflation of the lungs. Electronically Signed   By: Fidela Salisbury M.D.   On: 09/29/2017 16:15    EKG:   Atrial fibrillation bradycardic49 bpm  IMPRESSION AND PLAN:   1.  Clinical sepsis but unclear cause.  Patient with hypotension, bradycardia and hypothermia.  Empiric antibiotics.  Follow-up blood cultures and urine culture.  Patient received 4 L of fluid.  Also started on Levophed drip for hypotension.  Can consider dopamine  for the bradycardia.  Ordered glucagon and calcium to try to reverse beta-blocker for her bradycardia.  Warming blanket for the hypothermia.  Hold Cardizem CD and metoprolol.  TSH normal range.  Send off a.m. cortisol tomorrow morning. 2.  Acute kidney injury likely is from hypotension.  Continue to monitor.  This may worsen before it gets better. 3.  Chronic atrial fibrillation and DVT.  Continue Eliquis if able to take.  If unable to take may have to switch over to heparin. 4.  Dementia. 5.  Sleep apnea 6.  History of CAD 7.  COPD   All the records are reviewed and case discussed with ED provider. Management plans discussed with the patient's family and they are in agreement.  CODE STATUS: DNR  TOTAL TIME TAKING CARE OF THIS PATIENT: 50 minutes, including acp time   Loletha Grayer M.D on 10/01/2017 at 6:21 PM  Between 7am to  6pm - Pager - [phone]  After 6pm call admission pager [phone]  [facility]  [phone]  CC: Primary care physician; Kirk Ruths, MD

## 2017-10-13 NOTE — ED Notes (Signed)
Report to ICU called by previous (day shift) RN. Update given to ICU regarding improved BP and HR and status of dopamine/levophed drips.

## 2017-10-14 ENCOUNTER — Encounter: Payer: Self-pay | Admitting: *Deleted

## 2017-10-14 ENCOUNTER — Inpatient Hospital Stay: Payer: Self-pay

## 2017-10-14 ENCOUNTER — Inpatient Hospital Stay: Payer: Medicare Other

## 2017-10-14 DIAGNOSIS — G9340 Encephalopathy, unspecified: Secondary | ICD-10-CM

## 2017-10-14 DIAGNOSIS — R001 Bradycardia, unspecified: Secondary | ICD-10-CM

## 2017-10-14 LAB — GLUCOSE, CAPILLARY
GLUCOSE-CAPILLARY: 74 mg/dL (ref 70–99)
Glucose-Capillary: 122 mg/dL — ABNORMAL HIGH (ref 70–99)

## 2017-10-14 LAB — CBC
HEMATOCRIT: 39.8 % (ref 35.0–47.0)
HEMOGLOBIN: 12.7 g/dL (ref 12.0–16.0)
MCH: 25.3 pg — ABNORMAL LOW (ref 26.0–34.0)
MCHC: 31.9 g/dL — AB (ref 32.0–36.0)
MCV: 79.3 fL — ABNORMAL LOW (ref 80.0–100.0)
Platelets: 173 10*3/uL (ref 150–440)
RBC: 5.02 MIL/uL (ref 3.80–5.20)
RDW: 19.9 % — AB (ref 11.5–14.5)
WBC: 9.9 10*3/uL (ref 3.6–11.0)

## 2017-10-14 LAB — BASIC METABOLIC PANEL
ANION GAP: 9 (ref 5–15)
BUN: 113 mg/dL — AB (ref 8–23)
CALCIUM: 8.4 mg/dL — AB (ref 8.9–10.3)
CO2: 20 mmol/L — ABNORMAL LOW (ref 22–32)
Chloride: 105 mmol/L (ref 98–111)
Creatinine, Ser: 2.92 mg/dL — ABNORMAL HIGH (ref 0.44–1.00)
GFR calc Af Amer: 16 mL/min — ABNORMAL LOW (ref >60)
GFR, EST NON AFRICAN AMERICAN: 14 mL/min — AB (ref >60)
Glucose, Bld: 100 mg/dL — ABNORMAL HIGH (ref 70–99)
POTASSIUM: 5.1 mmol/L (ref 3.5–5.1)
SODIUM: 134 mmol/L — AB (ref 135–145)

## 2017-10-14 LAB — MRSA PCR SCREENING: MRSA BY PCR: POSITIVE — AB

## 2017-10-14 LAB — URINE CULTURE: Culture: NO GROWTH

## 2017-10-14 LAB — CORTISOL: CORTISOL PLASMA: 19.5 ug/dL

## 2017-10-14 LAB — PROCALCITONIN: PROCALCITONIN: 1.02 ng/mL

## 2017-10-14 LAB — CORTISOL-AM, BLOOD: Cortisol - AM: 23.4 ug/dL — ABNORMAL HIGH (ref 6.7–22.6)

## 2017-10-14 MED ORDER — NYSTATIN 100000 UNIT/GM EX POWD
100000.0000 [IU] | Freq: Three times a day (TID) | CUTANEOUS | Status: DC
  Administered 2017-10-14 – 2017-10-17 (×9): 100000 [IU] via TOPICAL
  Filled 2017-10-14: qty 15

## 2017-10-14 MED ORDER — DEXTROSE-NACL 5-0.45 % IV SOLN
INTRAVENOUS | Status: DC
  Administered 2017-10-14: 50 mL/h via INTRAVENOUS
  Administered 2017-10-15: 11:00:00 via INTRAVENOUS

## 2017-10-14 MED ORDER — LIDOCAINE HCL 1 % IJ SOLN
0.0000 mL | Freq: Once | INTRAMUSCULAR | Status: DC | PRN
  Filled 2017-10-14: qty 30

## 2017-10-14 MED FILL — Medication: Qty: 1 | Status: AC

## 2017-10-14 NOTE — Progress Notes (Signed)
[facility] [place] NAME: Madeline Cunningham    MR#:  [id number]  DATE OF BIRTH:  Nov 20, 1929  SUBJECTIVE:  CHIEF COMPLAINT:   Chief Complaint  Patient presents with  . Altered Mental Status   The patient is demented, on dopamine drip. REVIEW OF SYSTEMS:  Review of Systems  Unable to perform ROS: Dementia    DRUG ALLERGIES:   Allergies  Allergen Reactions  . Promethazine Anaphylaxis  . Prempro [Conj Estrog-Medroxyprogest Ace] Other (See Comments)    Pin in my breast  . Evista [Raloxifene] Other (See Comments)    Breast pain  . Zoloft [Sertraline Hcl] Other (See Comments)    Pt does not remember what type of reaction.   VITALS:  Blood pressure (!) 98/48, pulse 84, temperature (!) 97 F (36.1 C), resp. rate 11, weight 118.9 kg, SpO2 97 %. PHYSICAL EXAMINATION:  Physical Exam  Constitutional:  Morbid obesity.  HENT:  Head: Normocephalic.  Mouth/Throat: Oropharynx is clear and moist.  Eyes: Conjunctivae and EOM are normal. No scleral icterus.  Both pupils are dilated, not reactive to light.  Neck: Neck supple. No JVD present. No tracheal deviation present.  Cardiovascular: Normal rate, regular rhythm and normal heart sounds. Exam reveals no gallop.  No murmur heard. Pulmonary/Chest: Effort normal and breath sounds normal. No respiratory distress. She has no wheezes. She has no rales.  Abdominal: Soft. Bowel sounds are normal. She exhibits no distension. There is no tenderness. There is no rebound.  Musculoskeletal: She exhibits edema. She exhibits no tenderness.  Skin: No rash noted. No erythema.  Psychiatric:  Demented and drowsy.   LABORATORY PANEL:  Female CBC Recent Labs  Lab 10/14/17 0425  WBC 9.9  HGB 12.7  HCT 39.8  PLT 173   ------------------------------------------------------------------------------------------------------------------ Chemistries  Recent Labs  Lab 10/12/2017 2104 10/14/17 0425  NA 132*  134*  K 4.6 5.1  CL 104 105  CO2 18* 20*  GLUCOSE 151* 100*  BUN 114* 113*  CREATININE 2.71* 2.92*  CALCIUM 8.2* 8.4*  MG 3.1*  --   AST 30  --   ALT 24  --   ALKPHOS 181*  --   BILITOT 1.2  --    RADIOLOGY:  Dg Abd 1 View  Result Date: 10/16/2017 CLINICAL DATA:  Sepsis EXAM: ABDOMEN - 1 VIEW COMPARISON:  CT abdomen and pelvis April 07, 2004 FINDINGS: There is moderate stool in the colon. There is no bowel dilatation or air-fluid level to suggest bowel obstruction. No evident free air. Lung bases are clear. There is a total hip replacement on the left. IMPRESSION: No evident bowel obstruction or free air.  Lung bases clear. Electronically Signed   By: Lowella Grip III M.D.   On: 10/11/2017 21:43   Ct Head Wo Contrast  Result Date: 10/02/2017 CLINICAL DATA:  Altered mental status. EXAM: CT HEAD WITHOUT CONTRAST TECHNIQUE: Contiguous axial images were obtained from the base of the skull through the vertex without intravenous contrast. COMPARISON:  April 01, 2009 FINDINGS: Brain: There is moderate frontal atrophy bilaterally. There is mild diffuse atrophy elsewhere. There is no intracranial mass, hemorrhage, extra-axial fluid collection, or midline shift. There is evidence of a prior focal infarct in the posterior right frontal lobe near the frontal-temporal junction. Elsewhere there is mild periventricular small vessel disease. There is no new gray-white compartment lesion. No acute infarct evident. Vascular: No evident hyperdense vessel. There is calcification in each carotid siphon region. Skull: The  bony calvarium appears intact. There are foci of soft tissue air over the anterior and right face regions. Sinuses/Orbits: There is mucosal thickening in multiple ethmoid air cells. There is mild mucosal thickening in the left sphenoid sinus. Other visualized paranasal sinuses are clear. Visualized orbits appear symmetric bilaterally. Other: Visualized mastoid air cells are clear. There is  debris in each external auditory canal. IMPRESSION: 1. Mild generalized atrophy with moderate atrophy in the frontal lobe regions. 2. Prior posterior right frontal infarct, stable. Elsewhere there is mild periventricular small vessel disease. No acute infarct evident. No mass or hemorrhage. 3.  There are foci of arterial vascular calcification. 4. Areas of paranasal sinus disease. Probable cerumen in each external auditory canal. Electronically Signed   By: Lowella Grip III M.D.   On: 10/08/2017 17:20   Dg Chest Port 1 View  Result Date: 10/14/2017 CLINICAL DATA:  Central line placement. EXAM: PORTABLE CHEST 1 VIEW COMPARISON:  10/21/2017 FINDINGS: Right internal jugular line is difficult to follow centrally. May terminate over the superior caval/atrial junction or high right atrium. Moderate cardiomegaly. Atherosclerosis in the transverse aorta. No right and no definite left pleural effusion. No pneumothorax. No congestive failure. Increased density at the left lung base. IMPRESSION: Right internal jugular line tip is difficult to follow. Favored to terminate at the superior caval/atrial junction or high right atrium. Consider repeat frontal radiograph with overpenetration. No pneumothorax. Cardiomegaly without congestive failure. Increased density projecting over the left lung base is favored to be due to overlying soft tissues. Electronically Signed   By: Abigail Miyamoto M.D.   On: 10/14/2017 13:13   Dg Chest Port 1 View  Result Date: 10/10/2017 CLINICAL DATA:  Altered mental status. EXAM: PORTABLE CHEST 1 VIEW COMPARISON:  05/18/2013 FINDINGS: Enlarged cardiac silhouette. Calcific atherosclerotic disease of the aorta. Mediastinal contours appear intact. Hypoinflation of the lungs. There is no evidence of focal airspace consolidation, pleural effusion or pneumothorax. Mild prominence of the interstitium. Osseous structures are without acute abnormality. Soft tissues are grossly normal. Shock pads  overlies the left thorax. Electronic apparatus seen in the lower thorax. IMPRESSION: Mild prominence of the interstitium may represent pulmonary vascular congestion or may be secondary to hypoinflation of the lungs. Electronically Signed   By: Fidela Salisbury M.D.   On: 10/21/2017 16:15   [place] Ekg Site Rite  Result Date: 10/14/2017 If Site Rite image not attached, placement could not be confirmed due to current cardiac rhythm.  ASSESSMENT AND PLAN:  1.  Clinical sepsis but unclear cause.  Patient with hypotension, bradycardia and hypothermia.  Continue Empiric antibiotics.  Follow-up blood cultures and urine culture.    2.  Hypotension.  Patient received 4 L of fluid.  Disontinued Levophed drip for hypotension.    Changed to dopamine drip. Warming blanket for the hypothermia.  Hold Cardizem CD and metoprolol.  TSH normal range. Cortisol is normal.  Bradycardia.  Improved.  2.  Acute kidney injury, ATN, likely is from hypotension.  Follow-up BMP.  3.  Chronic atrial fibrillation and DVT.  Continue Eliquis.  4.  Dementia. 5.  Sleep apnea and morbid obesity. 6.  History of CAD 7.  COPD.  Stable.  All the records are reviewed and case discussed with Care Management/Social Worker. Management plans discussed with the patient, family and they are in agreement.  CODE STATUS: DNR  TOTAL TIME TAKING CARE OF THIS PATIENT: 36 minutes.   More than 50% of the time was spent in counseling/coordination of care: YES  POSSIBLE D/C IN 3 DAYS, DEPENDING ON CLINICAL CONDITION.   Demetrios Loll M.D on 10/14/2017 at 1:51 PM  Between 7am to 6pm - Pager - [phone]  After 6pm go to [email or web address] - [facility]

## 2017-10-14 NOTE — Clinical Social Work Note (Signed)
Clinical Social Work Assessment  Patient Details  Name: Madeline Cunningham MRN: [id number] Date of Birth: Jan 31, 1930  Date of referral:  10/14/17               Reason for consult:  Facility Placement                Permission sought to share information with:  Chartered certified accountant granted to share information::  Yes, Verbal Permission Granted  Name::        Agency::  [facility] area SNFs  Relationship::     Contact Information:     Housing/Transportation Living arrangements for the past 2 months:  [place] of Information:  Medical Team, Adult Children Patient Interpreter Needed:  None Criminal Activity/Legal Involvement Pertinent to Current Situation/Hospitalization:  No - Comment as needed Significant Relationships:  Adult Children, Warehouse manager, Spouse, Other Family Members Lives with:  Facility Resident Do you feel safe going back to the place where you live?  Yes Need for family participation in patient care:  Yes (Comment)(Patient has dementia and is not alert or oriented at this time)  Care giving concerns:  The patient admitted from [facility] at [facility] and cannot return due to level of care needs.   Social Worker assessment / plan:  The CSW met with the patient's family outside of the room as the patient was receiving care at the time. The patient's daughter and grandson were in attendance. Madeline Cunningham explained that her mother was originally having short term rehab at [facility] then was transitioned to the MCU at that facility for ongoing supportive care. Due to the elevation of the patient's care needs, [facility] can no longer care for the patient. The family verbalized consent to begin a referral for LTC options and will discuss further tomorrow as the patient's clinical needs become more apparent. Currently, the patient's sepsis etiology is unknown. CSW will follow for discharge planning and facilitation.   Employment status:   Retired Forensic scientist:  Medicare PT Recommendations:  Not assessed at this time [place] / Referral to community resources:  [place]  Patient/Family's Response to care:  The patient's family thanked the CSW.  Patient/Family's Understanding of and Emotional Response to Diagnosis, Current Treatment, and Prognosis:  The patient's family understands that the patient may be nearing end of life needs and are discussing long term care plans with an emphasis on quality of life.  Emotional Assessment Appearance:  Appears stated age Attitude/Demeanor/Rapport:  Lethargic Affect (typically observed):  Stable Orientation:  Oriented to Self Alcohol / Substance use:  Never Used Psych involvement (Current and /or in the community):  No (Comment)  Discharge Needs  Concerns to be addressed:  Care Coordination, Discharge Planning Concerns Readmission within the last 30 days:  No Current discharge risk:  Chronically ill, Cognitively Impaired Barriers to Discharge:  Continued Medical Work up   [employer], LCSW 10/14/2017, 3:12 PM

## 2017-10-14 NOTE — Consult Note (Signed)
[facility] Cardiology  CARDIOLOGY CONSULT NOTE  Patient ID: Madeline Cunningham MRN: [id number] DOB/AGE: 1929/10/23 84 y.o.  Admit date: 10/21/2017 Referring Physician Bridgett Larsson Primary Physician [facility] Primary Cardiologist Shahab Polhamus Reason for Consultation bradycardia  HPI: 84 year old female referred for evaluation of bradycardia.  The patient currently is a resident at memory care at [facility].  Patient had a recent history of waxing and waning mental status, with fluid retention.  She presents to [facility] emergency room with hypotension, hypothermia, decreased mental status, acute renal failure, all consistent with sepsis.  Noted to be bradycardic, EKG revealed atrial fibrillation at a rate of 49 bpm.  The patient currently is on metoprolol 200 mg daily which has been held.  Admitted to ICU where telemetry reveals atrial fibrillation at a rate of 85 bpm.  Recent 2D echocardiogram 08/02/2015 revealed normal left ventricular function, with LVEF greater than 55%.  Patient has known coronary artery disease, with occluded RCA by cardiac catheterization 04/26/2010.  Troponin is less than 0.03.  Patient's had no recent history of chest pain.  Review of systems complete and found to be negative unless listed above     Past Medical History:  Diagnosis Date  . Arthritis   . Asthma   . Atrial fibrillation, unspecified   . Cancer (HCC)    rt side  . COPD (chronic obstructive pulmonary disease) ([place])   . Coronary artery disease   . Depression   . Dysrhythmia    A-fib  . Sleep apnea     Past Surgical History:  Procedure Laterality Date  . APPENDECTOMY    . CARDIAC CATHETERIZATION    . CARPAL TUNNEL RELEASE Right 04/27/2015   Procedure: CARPAL TUNNEL RELEASE;  Surgeon: Hessie Knows, MD;  Location: [facility] ORS;  Service: Orthopedics;  Laterality: Right;  . CATARACT EXTRACTION W/ INTRAOCULAR LENS IMPLANT Right 03/25/2007  . CATARACT EXTRACTION W/ INTRAOCULAR LENS IMPLANT Left 2010  . FRACTURE SURGERY Left    ORIF  wrist  . LOWER EXTREMITY ANGIOGRAPHY N/A 05/05/2017   Procedure: LOWER EXTREMITY ANGIOGRAPHY;  Surgeon: Katha Cabal, MD;  Location: [place] CV LAB;  Service: Cardiovascular;  Laterality: N/A;  . LOWER EXTREMITY INTERVENTION N/A 05/05/2017   Procedure: LOWER EXTREMITY INTERVENTION;  Surgeon: Katha Cabal, MD;  Location: [place] CV LAB;  Service: Cardiovascular;  Laterality: N/A;    Medications Prior to Admission  Medication Sig Dispense Refill Last Dose  . acetaminophen (TYLENOL) 500 MG tablet Take 500 mg by mouth daily as needed for mild pain.   PRN at PRN  . Amino Acids-Protein Hydrolys (FEEDING SUPPLEMENT, PRO-STAT SUGAR FREE 64,) LIQD Take 30 mLs by mouth every evening.   10/12/2017 at 1800  . busPIRone (BUSPAR) 10 MG tablet Take 10 mg by mouth 2 (two) times daily.   10/02/2017 at 0800  . diltiazem (TIAZAC) 240 MG 24 hr capsule Take 120 mg by mouth daily with lunch.   10/08/2017 at 1200  . ELIQUIS 2.5 MG TABS tablet Take 2.5 mg by mouth 2 (two) times daily.   98 10/09/2017 at 0800  . FLUoxetine (PROZAC) 10 MG capsule Take 10 mg by mouth every morning.    10/17/2017 at 0800  . furosemide (LASIX) 40 MG tablet Take 1 tablet (40MG ) by mouth daily and 1 additional tablet (40MG ) daily as needed for weight gain over 240lbs   10/20/2017 at 0800  . gabapentin (NEURONTIN) 100 MG capsule Take 200 mg by mouth 3 (three) times daily.   10/27/2017 at 1300  .  HYDROcodone-acetaminophen (NORCO) 5-325 MG tablet Take 1 tablet by mouth every 6 (six) hours as needed for moderate pain. 20 tablet 0 PRN at PRN  . LORazepam (ATIVAN) 0.5 MG tablet Take 1 tablet (0.5 mg total) by mouth every 12 (twelve) hours as needed for anxiety. 20 tablet 0 PRN at PRN  . metoprolol (TOPROL-XL) 200 MG 24 hr tablet Take 200 mg by mouth at bedtime.   10/12/2017 at 2000  . nitroGLYCERIN (NITROSTAT) 0.4 MG SL tablet Place 0.4 mg under the tongue every 5 (five) minutes as needed for chest pain.   PRN at PRN  . pravastatin  (PRAVACHOL) 80 MG tablet Take 80 mg by mouth at bedtime.   10/12/2017 at 2000  . ropinirole (REQUIP) 5 MG tablet Take 5 mg by mouth at bedtime.   10/12/2017 at [place] History  . Marital status: Married    Spouse name: Not on file  . Number of children: Not on file  . Years of education: Not on file  . Highest education level: Not on file  Occupational History  . Not on file  Social Needs  . Financial resource strain: Not on file  . Food insecurity:    Worry: Not on file    Inability: Not on file  . Transportation needs:    Medical: Not on file    Non-medical: Not on file  Tobacco Use  . Smoking status: Former Smoker    Last attempt to quit: 04/20/1975    Years since quitting: 42.5  . Smokeless tobacco: Never Used  Substance and Sexual Activity  . Alcohol use: No  . Drug use: No  . Sexual activity: Not on file  Lifestyle  . Physical activity:    Days per week: Not on file    Minutes per session: Not on file  . Stress: Not on file  Relationships  . Social connections:    Talks on phone: Not on file    Gets together: Not on file    Attends religious service: Not on file    Active member of club or organization: Not on file    Attends meetings of clubs or organizations: Not on file    Relationship status: Not on file  . Intimate partner violence:    Fear of current or ex partner: Not on file    Emotionally abused: Not on file    Physically abused: Not on file    Forced sexual activity: Not on file  Other Topics Concern  . Not on file  Social History Narrative  . Not on file    Family History  Problem Relation Age of Onset  . CVA Mother   . CVA Father   . CAD Father   . Varicose Veins Neg Hx   . Vision loss Neg Hx       Review of systems complete and found to be negative unless listed above      PHYSICAL EXAM  General: Well developed, well nourished, in no acute distress HEENT:  Normocephalic and atramatic Neck:  No JVD.   Lungs: Clear bilaterally to auscultation and percussion. Heart: HRRR . Normal S1 and S2 without gallops or murmurs.  Abdomen: Bowel sounds are positive, abdomen soft and non-tender  Msk:  Back normal, normal gait. Normal strength and tone for age. Extremities: No clubbing, cyanosis or edema.   Neuro: Alert and oriented X 3. Psych:  Good affect, responds appropriately  Labs:   Lab Results  Component Value Date   WBC 9.9 10/14/2017   HGB 12.7 10/14/2017   HCT 39.8 10/14/2017   MCV 79.3 (L) 10/14/2017   PLT 173 10/14/2017    Recent Labs  Lab 10/14/2017 2104 10/14/17 0425  NA 132* 134*  K 4.6 5.1  CL 104 105  CO2 18* 20*  BUN 114* 113*  CREATININE 2.71* 2.92*  CALCIUM 8.2* 8.4*  PROT 6.9  --   BILITOT 1.2  --   ALKPHOS 181*  --   ALT 24  --   AST 30  --   GLUCOSE 151* 100*   Lab Results  Component Value Date   CKTOTAL 70 01/10/2012   CKMB 3.5 05/19/2013   TROPONINI <0.03 10/24/2017    Lab Results  Component Value Date   CHOL 136 05/19/2013   CHOL 152 01/10/2012   Lab Results  Component Value Date   HDL 46 05/19/2013   HDL 48 01/10/2012   Lab Results  Component Value Date   LDLCALC 58 05/19/2013   LDLCALC 68 01/10/2012   Lab Results  Component Value Date   TRIG 162 05/19/2013   TRIG 182 01/10/2012   No results found for: CHOLHDL No results found for: LDLDIRECT    Radiology: Dg Abd 1 View  Result Date: 10/27/2017 CLINICAL DATA:  Sepsis EXAM: ABDOMEN - 1 VIEW COMPARISON:  CT abdomen and pelvis April 07, 2004 FINDINGS: There is moderate stool in the colon. There is no bowel dilatation or air-fluid level to suggest bowel obstruction. No evident free air. Lung bases are clear. There is a total hip replacement on the left. IMPRESSION: No evident bowel obstruction or free air.  Lung bases clear. Electronically Signed   By: Lowella Grip III M.D.   On: 10/06/2017 21:43   Ct Head Wo Contrast  Result Date: 10/01/2017 CLINICAL DATA:  Altered mental status.  EXAM: CT HEAD WITHOUT CONTRAST TECHNIQUE: Contiguous axial images were obtained from the base of the skull through the vertex without intravenous contrast. COMPARISON:  April 01, 2009 FINDINGS: Brain: There is moderate frontal atrophy bilaterally. There is mild diffuse atrophy elsewhere. There is no intracranial mass, hemorrhage, extra-axial fluid collection, or midline shift. There is evidence of a prior focal infarct in the posterior right frontal lobe near the frontal-temporal junction. Elsewhere there is mild periventricular small vessel disease. There is no new gray-white compartment lesion. No acute infarct evident. Vascular: No evident hyperdense vessel. There is calcification in each carotid siphon region. Skull: The bony calvarium appears intact. There are foci of soft tissue air over the anterior and right face regions. Sinuses/Orbits: There is mucosal thickening in multiple ethmoid air cells. There is mild mucosal thickening in the left sphenoid sinus. Other visualized paranasal sinuses are clear. Visualized orbits appear symmetric bilaterally. Other: Visualized mastoid air cells are clear. There is debris in each external auditory canal. IMPRESSION: 1. Mild generalized atrophy with moderate atrophy in the frontal lobe regions. 2. Prior posterior right frontal infarct, stable. Elsewhere there is mild periventricular small vessel disease. No acute infarct evident. No mass or hemorrhage. 3.  There are foci of arterial vascular calcification. 4. Areas of paranasal sinus disease. Probable cerumen in each external auditory canal. Electronically Signed   By: Lowella Grip III M.D.   On: 10/12/2017 17:20   Dg Chest Port 1 View  Result Date: 10/04/2017 CLINICAL DATA:  Altered mental status. EXAM: PORTABLE CHEST 1 VIEW COMPARISON:  05/18/2013 FINDINGS: Enlarged cardiac silhouette. Calcific atherosclerotic disease of the aorta. Mediastinal  contours appear intact. Hypoinflation of the lungs. There is no  evidence of focal airspace consolidation, pleural effusion or pneumothorax. Mild prominence of the interstitium. Osseous structures are without acute abnormality. Soft tissues are grossly normal. Shock pads overlies the left thorax. Electronic apparatus seen in the lower thorax. IMPRESSION: Mild prominence of the interstitium may represent pulmonary vascular congestion or may be secondary to hypoinflation of the lungs. Electronically Signed   By: Fidela Salisbury M.D.   On: 10/19/2017 16:15   [place] Ekg Site Rite  Result Date: 10/14/2017 If Site Rite image not attached, placement could not be confirmed due to current cardiac rhythm.   EKG: Atrial fibrillation with slow ventricular rate  ASSESSMENT AND PLAN:   1.  Bradycardia, atrial fibrillation with slow ventricular rate, proved after holding metoprolol 2.  Paroxysmal atrial fibrillation, on low-dose Eliquis for stroke prevention 3.  Known CAD, no chest pain, negative troponin  Recommendations  1.  Agree with overall current therapy 2.  Metoprolol for now, may need to start at lower dose to prevent rebound tachycardia and rapid atrial fibrillation 3.  Continue Eliquis for stroke prevention 4.  No further cardiac diagnostics at this time  Signed: Isaias Cowman MD,PhD, [facility] 10/14/2017, 10:06 AM

## 2017-10-14 NOTE — NC FL2 (Signed)
[place] LEVEL OF CARE SCREENING TOOL     IDENTIFICATION  Patient Name: Madeline Cunningham Birthdate: Nov 25, 1929 Sex: female Admission Date (Current Location): 10/21/2017  [employer] and [place] Number:  Engineering geologist and Address:  [facility], [address], [place], [place] [address]      Provider Number: [id number]  Attending Physician Name and Address:  Demetrios Loll, MD  Relative Name and Phone Number:  Doralee Albino (daughter) [phone]    Current Level of Care: Hospital Recommended Level of Care: [place] Prior Approval Number:    Date Approved/Denied:   PASRR Number: [id number]  Discharge Plan: SNF    Current Diagnoses: Patient Active Problem List   Diagnosis Date Noted  . Sepsis ([place]) 10/22/2017  . Arterial embolism ([place]) 06/17/2017  . A-fib ([place]) 06/17/2017  . Hyperlipidemia 06/17/2017  . Essential hypertension 06/17/2017  . Ischemic leg 05/05/2017  . 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 05/05/2017    Orientation RESPIRATION BLADDER Height & Weight     Self  Normal Incontinent Weight: 262 lb 2 oz (118.9 kg) Height:     BEHAVIORAL SYMPTOMS/MOOD NEUROLOGICAL BOWEL NUTRITION STATUS      Incontinent Diet(NPO to be advanced)  AMBULATORY STATUS COMMUNICATION OF NEEDS Skin   Extensive Assist Verbally Normal                       Personal Care Assistance Level of Assistance  Bathing, Feeding, Dressing Bathing Assistance: Maximum assistance Feeding assistance: Limited assistance Dressing Assistance: Maximum assistance     Functional Limitations Info  Sight, Hearing, Speech Sight Info: Adequate Hearing Info: Impaired Speech Info: Adequate    SPECIAL CARE FACTORS FREQUENCY                       Contractures Contractures Info: Not present    Additional Factors Info  Code Status, Allergies, Psychotropic Code Status Info: DNR Allergies Info:  Promethazine,  Prempro Conj Estrog-medroxyprogest Ace, Evista Raloxifene, Zoloft Sertraline Hcl Psychotropic Info: Buspar         Current Medications (10/14/2017):  This is the current hospital active medication list Current Facility-Administered Medications  Medication Dose Route Frequency Provider Last Rate Last Dose  . 0.9 %  sodium chloride infusion   Intravenous Continuous Wieting, Richard, MD      . acetaminophen (TYLENOL) tablet 650 mg  650 mg Oral Q6H PRN Loletha Grayer, MD       Or  . acetaminophen (TYLENOL) suppository 650 mg  650 mg Rectal Q6H PRN Loletha Grayer, MD      . apixaban Arne Cleveland) tablet 2.5 mg  2.5 mg Oral BID Wieting, Richard, MD      . busPIRone (BUSPAR) tablet 10 mg  10 mg Oral BID Wieting, Richard, MD      . ceFEPIme (MAXIPIME) 2 g in sodium chloride 0.9 % 100 mL IVPB  2 g Intravenous Q24H Wieting, Richard, MD      . dextrose 5 %-0.45 % sodium chloride infusion   Intravenous Continuous Conforti, John, DO 50 mL/hr at 10/14/17 1440 50 mL/hr at 10/14/17 1440  . DOPamine (INTROPIN) 800 mg in dextrose 5 % 250 mL (3.2 mg/mL) infusion  0-20 mcg/kg/min Intravenous Titrated Loletha Grayer, MD 17.9 mL/hr at 10/14/17 1400 10 mcg/kg/min at 10/14/17 1400  . FLUoxetine (PROZAC) capsule 10 mg  10 mg Oral Haskel Khan, Richard, MD      . glucagon (human recombinant) (  GLUCAGEN) injection 5 mg  5 mg Intravenous Once Loletha Grayer, MD   Stopped at 10/17/2017 1622  . lidocaine (XYLOCAINE) 1 % (with pres) injection 0-30 mL  0-30 mL Intradermal Once PRN Conforti, John, DO      . LORazepam (ATIVAN) tablet 0.5 mg  0.5 mg Oral Q12H PRN Wieting, Richard, MD      . metroNIDAZOLE (FLAGYL) IVPB 500 mg  500 mg Intravenous Q8H Loletha Grayer, MD   Stopped at 10/14/17 1000  . nystatin (MYCOSTATIN/NYSTOP) topical powder 100,000 Units  100,000 Units Topical Q8H Tukov-Yual, Magdalene S, NP   100,000 Units at 10/14/17 1346  . ondansetron (ZOFRAN) tablet 4 mg  4 mg Oral Q6H PRN Loletha Grayer, MD        Or  . ondansetron (ZOFRAN) injection 4 mg  4 mg Intravenous Q6H PRN Wieting, Richard, MD      . vancomycin (VANCOCIN) IVPB 1000 mg/200 mL premix  1,000 mg Intravenous Q48H Loletha Grayer, MD         Discharge Medications: Please see discharge summary for a list of discharge medications.  Relevant Imaging Results:  Relevant Lab Results:   Additional Information [phone]  Zettie Pho, LCSW

## 2017-10-15 LAB — BASIC METABOLIC PANEL
ANION GAP: 9 (ref 5–15)
BUN: 113 mg/dL — AB (ref 8–23)
CHLORIDE: 105 mmol/L (ref 98–111)
CO2: 18 mmol/L — ABNORMAL LOW (ref 22–32)
Calcium: 8.2 mg/dL — ABNORMAL LOW (ref 8.9–10.3)
Creatinine, Ser: 3.22 mg/dL — ABNORMAL HIGH (ref 0.44–1.00)
GFR calc non Af Amer: 12 mL/min — ABNORMAL LOW (ref >60)
GFR, EST AFRICAN AMERICAN: 14 mL/min — AB (ref >60)
Glucose, Bld: 211 mg/dL — ABNORMAL HIGH (ref 70–99)
POTASSIUM: 4.9 mmol/L (ref 3.5–5.1)
SODIUM: 132 mmol/L — AB (ref 135–145)

## 2017-10-15 LAB — PROCALCITONIN: Procalcitonin: 1.77 ng/mL

## 2017-10-15 LAB — T3: T3, Total: 60 ng/dL — ABNORMAL LOW (ref 71–180)

## 2017-10-15 LAB — PHOSPHORUS: PHOSPHORUS: 7.9 mg/dL — AB (ref 2.5–4.6)

## 2017-10-15 LAB — T4: T4 TOTAL: 5.5 ug/dL (ref 4.5–12.0)

## 2017-10-15 LAB — GLUCOSE, CAPILLARY
GLUCOSE-CAPILLARY: 103 mg/dL — AB (ref 70–99)
GLUCOSE-CAPILLARY: 117 mg/dL — AB (ref 70–99)
GLUCOSE-CAPILLARY: 91 mg/dL (ref 70–99)

## 2017-10-15 LAB — MAGNESIUM: MAGNESIUM: 2.9 mg/dL — AB (ref 1.7–2.4)

## 2017-10-15 MED ORDER — CHLORHEXIDINE GLUCONATE CLOTH 2 % EX PADS
6.0000 | MEDICATED_PAD | Freq: Every day | CUTANEOUS | Status: DC
  Administered 2017-10-15: 6 via TOPICAL

## 2017-10-15 MED ORDER — MUPIROCIN 2 % EX OINT
1.0000 "application " | TOPICAL_OINTMENT | Freq: Two times a day (BID) | CUTANEOUS | Status: DC
  Administered 2017-10-15 – 2017-10-17 (×5): 1 via NASAL
  Filled 2017-10-15: qty 22

## 2017-10-15 MED ORDER — ZIPRASIDONE MESYLATE 20 MG IM SOLR
10.0000 mg | Freq: Once | INTRAMUSCULAR | Status: AC
  Administered 2017-10-15: 10 mg via INTRAMUSCULAR
  Filled 2017-10-15: qty 20

## 2017-10-15 NOTE — Progress Notes (Addendum)
Late Note- Family agreeable with comfort care for patient. Even husband is very realistic about patients prognosis.Patient has been very alert and talkative all morning although she is not even oriented to self. Skin very weepy and gown and bed changed x 2. Patient is resting quietly and blood pressure up and down.

## 2017-10-15 NOTE — Progress Notes (Addendum)
[place] [facility] [place] NAME: Madeline Cunningham    MR#:  [id number]  DATE OF BIRTH:  Jun 30, 1929  SUBJECTIVE:  Alert and talkative today. No concerns. Denies fevers or chills. REVIEW OF SYSTEMS:  Review of Systems  Constitutional: Negative for chills and fever.  HENT: Negative for congestion and sore throat.   Eyes: Negative for blurred vision and double vision.  Respiratory: Negative for cough and shortness of breath.   Cardiovascular: Negative for chest pain and palpitations.  Gastrointestinal: Negative for nausea and vomiting.  Genitourinary: Negative for dysuria and frequency.  Musculoskeletal: Negative for neck pain.  Neurological: Negative for dizziness and headaches.    DRUG ALLERGIES:   Allergies  Allergen Reactions  . Promethazine Anaphylaxis  . Prempro [Conj Estrog-Medroxyprogest Ace] Other (See Comments)    Pin in my breast  . Evista [Raloxifene] Other (See Comments)    Breast pain  . Zoloft [Sertraline Hcl] Other (See Comments)    Pt does not remember what type of reaction.   VITALS:  Blood pressure (!) 74/40, pulse (!) 43, temperature (!) 95.4 F (35.2 C), resp. rate 17, weight 118.9 kg, SpO2 93 %. PHYSICAL EXAMINATION:  Physical Exam  Constitutional: She is oriented to person, place, and time. She appears well-developed and well-nourished.  HENT:  Head: Normocephalic.  Mouth/Throat: Oropharynx is clear and moist.  Eyes: Pupils are equal, round, and reactive to light. Conjunctivae and EOM are normal. No scleral icterus.  Neck: Neck supple. No JVD present. No tracheal deviation present.  Cardiovascular: Normal rate, regular rhythm and normal heart sounds. Exam reveals no gallop.  No murmur heard. Pulmonary/Chest: Effort normal and breath sounds normal. No respiratory distress. She has no wheezes. She has no rales.  Abdominal: Soft. Bowel sounds are normal. She exhibits no distension. There is no tenderness. There is no  rebound.  Musculoskeletal: She exhibits edema. She exhibits no tenderness.  Neurological: She is alert and oriented to person, place, and time.  Skin: No rash noted. No erythema.  Psychiatric: She has a normal mood and affect.   LABORATORY PANEL:  Female CBC Recent Labs  Lab 10/14/17 0425  WBC 9.9  HGB 12.7  HCT 39.8  PLT 173   ------------------------------------------------------------------------------------------------------------------ Chemistries  Recent Labs  Lab 10/25/2017 2104  10/15/17 0452  NA 132*   < > 132*  K 4.6   < > 4.9  CL 104   < > 105  CO2 18*   < > 18*  GLUCOSE 151*   < > 211*  BUN 114*   < > 113*  CREATININE 2.71*   < > 3.22*  CALCIUM 8.2*   < > 8.2*  MG 3.1*  --  2.9*  AST 30  --   --   ALT 24  --   --   ALKPHOS 181*  --   --   BILITOT 1.2  --   --    < > = values in this interval not displayed.   RADIOLOGY:  No results found. ASSESSMENT AND PLAN:  Sepsis due to unclear etiology.  Patient with hypotension, bradycardia and hypothermia on admission.  - continue vancomycin and cefepime - Follow-up blood cultures and urine culture - palliative care consult    Hypotension- may be secondary to cardiac meds. Patient received 4 L of fluid. TSH and cortisol normal. - transitioned off dopamine drip today - per family discussion with CCM, would not restart pressors if she worsened - Hold Cardizem  CD and metoprolol.   Bradycardia- improved - holding home metoprolol  Acute kidney injury due to ATN, likely from hypotension. Cr increased from 2.92 > 3.22. - repeat bmp in the morning  Chronic atrial fibrillation and DVT - Continue Eliquis.  Dementia. - continue geodon  All the records are reviewed and case discussed with Care Management/Social Worker. Management plans discussed with the patient, family and they are in agreement.  CODE STATUS: DNR  TOTAL TIME TAKING CARE OF THIS PATIENT: 35 minutes.   More than 50% of the time was spent in  counseling/coordination of care: YES  POSSIBLE D/C IN 2-3 DAYS, DEPENDING ON CLINICAL CONDITION.   Berna Spare Mayo M.D on 10/15/2017 at 8:43 PM  Between 7am to 6pm - Pager - [phone]  After 6pm go to [email or web address] - [employer] [facility]

## 2017-10-15 NOTE — Progress Notes (Signed)
Patient ID: Madeline Cunningham, female   DOB: 1929-05-24, 84 y.o.   MRN: [id number] Pulmonary/critical care attending  Family discussion with husband and children Discuss with them weaning off of dopamine and not re-instituting. Also if her status were to deteriorate to transition her to comfort care and they are agreeable. We'll stop dopamine and monitor patient  Madeline Cunningham, D.O.

## 2017-10-15 NOTE — Progress Notes (Signed)
Follow up - Critical Care Medicine Note  Patient Details:    Madeline Cunningham is an 84 y.o. female on Eliquis for atrial fibrillation, who presented to the ED with acute change in mental status. EMS was called because patient's mental status got worse over the past 2 weeks. When EMS arrived, patient's heart rate was in the 30s and she was hypotensive. She takes metoprolol 200 mg p.o. nightly and diltiazem 120 mg daily. Her preliminary work-up in the ED showed a creatinine of 2.71, BNP of 651, and her chest x-ray showed mild interstitial pulmonary vascular congestion. She was started on broad-spectrum antibiotics as well as dopamine. She is being admitted to the ICU for further management  Lines, Airways, Drains: CVC Triple Lumen 10/14/17 Right Internal jugular 17 cm 1 cm (Active)  Indication for Insertion or Continuance of Line Vasoactive infusions 10/14/2017  8:00 PM  Site Assessment Clean;Dry;Intact 10/14/2017  8:00 PM  Proximal Lumen Status Infusing;Flushed;Blood return noted 10/14/2017  8:00 PM  Medial Lumen Status Infusing;Flushed;Blood return noted 10/14/2017  8:00 PM  Distal Lumen Status Flushed;Saline locked;Blood return noted 10/14/2017  8:00 PM  Dressing Type Transparent;Occlusive 10/14/2017  8:00 PM  Dressing Status Clean;Dry;Intact 10/14/2017  8:00 PM  Line Care Connections checked and tightened 10/14/2017  8:00 PM  Dressing Change Due 10/21/17 10/14/2017  8:00 PM     Urethral Catheter cassie, rn Temperature probe 14 Fr. (Active)  Indication for Insertion or Continuance of Catheter Unstable critical patients (first 24-48 hours) 10/15/2017  7:05 AM  Site Assessment Clean;Intact 10/15/2017  7:05 AM  Catheter Maintenance Bag below level of bladder;Catheter secured;Drainage bag/tubing not touching floor;No dependent loops;Seal intact;Bag emptied prior to transport 10/15/2017  7:05 AM  Collection Container Standard drainage bag 10/15/2017  7:05 AM  Securement Method Securing device (Describe)  10/15/2017  7:05 AM  Urinary Catheter Interventions Unclamped 10/15/2017  1:40 AM  Output (mL) 250 mL 10/15/2017  5:00 AM    Anti-infectives:  Anti-infectives (From admission, onward)   Start     Dose/Rate Route Frequency Ordered Stop   10/14/17 1800  ceFEPIme (MAXIPIME) 2 g in sodium chloride 0.9 % 100 mL IVPB     2 g 200 mL/hr over 30 Minutes Intravenous Every 24 hours 09/29/2017 2137     10/14/17 1600  vancomycin (VANCOCIN) IVPB 1000 mg/200 mL premix     1,000 mg 200 mL/hr over 60 Minutes Intravenous Every 48 hours 10/07/2017 2137     09/27/2017 1630  ceFEPIme (MAXIPIME) 2 g in sodium chloride 0.9 % 100 mL IVPB     2 g 200 mL/hr over 30 Minutes Intravenous  Once 10/07/2017 1623 10/05/2017 1704   10/10/2017 1630  metroNIDAZOLE (FLAGYL) IVPB 500 mg     500 mg 100 mL/hr over 60 Minutes Intravenous Every 8 hours 10/05/2017 1623     10/12/2017 1630  vancomycin (VANCOCIN) IVPB 1000 mg/200 mL premix     1,000 mg 200 mL/hr over 60 Minutes Intravenous  Once 10/04/2017 1623 09/28/2017 1828      Microbiology: Results for orders placed or performed during the hospital encounter of 10/22/2017  Blood Culture (routine x 2)     Status: None (Preliminary result)   Collection Time: 10/16/2017  3:47 PM  Result Value Ref Range Status   Specimen Description BLOOD R HAND  Final   Special Requests   Final    BOTTLES DRAWN AEROBIC AND ANAEROBIC Blood Culture adequate volume   Culture   Final    NO GROWTH < 24  HOURS Performed at [facility], [address] [place], [place] [address]    Report Status PENDING  Incomplete  Urine culture     Status: None   Collection Time: 09/29/2017  3:57 PM  Result Value Ref Range Status   Specimen Description   Final    URINE, RANDOM Performed at [facility] [place] [facility], [address] [place], [place] [address]    Special Requests   Final    NONE Performed at [facility], [address], [place], [place] [address]    Culture   Final    NO GROWTH Performed at  [facility], [place] [address], [place], [place] [address]    Report Status 10/14/2017 FINAL  Final  MRSA PCR Screening     Status: Abnormal   Collection Time: 10/01/2017  9:09 PM  Result Value Ref Range Status   MRSA by PCR POSITIVE (A) NEGATIVE Final    Comment:        The GeneXpert MRSA Assay (FDA approved for NASAL specimens only), is one component of a comprehensive MRSA colonization surveillance program. It is not intended to diagnose MRSA infection nor to guide or monitor treatment for MRSA infections. RESULT CALLED TO, READ BACK BY AND VERIFIED WITH: CYTNHIA VASQUEZ ON 10/14/17 AT 0014 [facility] Performed at [facility], [address] [place], [place] [address]   Blood Culture (routine x 2)     Status: None (Preliminary result)   Collection Time: 10/23/2017 11:31 PM  Result Value Ref Range Status   Specimen Description BLOOD LEFT FOREARM  Final   Special Requests   Final    BOTTLES DRAWN AEROBIC AND ANAEROBIC Blood Culture results may not be optimal due to an excessive volume of blood received in culture bottles   Culture   Final    NO GROWTH < 12 HOURS Performed at [facility], [address] [place], [place] [address]    Report Status PENDING  Incomplete    Studies: Dg Abd 1 View  Result Date: 10/11/2017 CLINICAL DATA:  Sepsis EXAM: ABDOMEN - 1 VIEW COMPARISON:  CT abdomen and pelvis April 07, 2004 FINDINGS: There is moderate stool in the colon. There is no bowel dilatation or air-fluid level to suggest bowel obstruction. No evident free air. Lung bases are clear. There is a total hip replacement on the left. IMPRESSION: No evident bowel obstruction or free air.  Lung bases clear. Electronically Signed   By: Lowella Grip III M.D.   On: 10/09/2017 21:43   Ct Head Wo Contrast  Result Date: 10/24/2017 CLINICAL DATA:  Altered mental status. EXAM: CT HEAD WITHOUT CONTRAST TECHNIQUE: Contiguous axial images were obtained from the base of the skull  through the vertex without intravenous contrast. COMPARISON:  April 01, 2009 FINDINGS: Brain: There is moderate frontal atrophy bilaterally. There is mild diffuse atrophy elsewhere. There is no intracranial mass, hemorrhage, extra-axial fluid collection, or midline shift. There is evidence of a prior focal infarct in the posterior right frontal lobe near the frontal-temporal junction. Elsewhere there is mild periventricular small vessel disease. There is no new gray-white compartment lesion. No acute infarct evident. Vascular: No evident hyperdense vessel. There is calcification in each carotid siphon region. Skull: The bony calvarium appears intact. There are foci of soft tissue air over the anterior and right face regions. Sinuses/Orbits: There is mucosal thickening in multiple ethmoid air cells. There is mild mucosal thickening in the left sphenoid sinus. Other visualized paranasal sinuses are  clear. Visualized orbits appear symmetric bilaterally. Other: Visualized mastoid air cells are clear. There is debris in each external auditory canal. IMPRESSION: 1. Mild generalized atrophy with moderate atrophy in the frontal lobe regions. 2. Prior posterior right frontal infarct, stable. Elsewhere there is mild periventricular small vessel disease. No acute infarct evident. No mass or hemorrhage. 3.  There are foci of arterial vascular calcification. 4. Areas of paranasal sinus disease. Probable cerumen in each external auditory canal. Electronically Signed   By: Lowella Grip III M.D.   On: 10/19/2017 17:20   Dg Chest Port 1 View  Result Date: 10/14/2017 CLINICAL DATA:  Central line placement. EXAM: PORTABLE CHEST 1 VIEW COMPARISON:  10/12/2017 FINDINGS: Right internal jugular line is difficult to follow centrally. May terminate over the superior caval/atrial junction or high right atrium. Moderate cardiomegaly. Atherosclerosis in the transverse aorta. No right and no definite left pleural effusion. No  pneumothorax. No congestive failure. Increased density at the left lung base. IMPRESSION: Right internal jugular line tip is difficult to follow. Favored to terminate at the superior caval/atrial junction or high right atrium. Consider repeat frontal radiograph with overpenetration. No pneumothorax. Cardiomegaly without congestive failure. Increased density projecting over the left lung base is favored to be due to overlying soft tissues. Electronically Signed   By: Abigail Miyamoto M.D.   On: 10/14/2017 13:13   Dg Chest Port 1 View  Result Date: 10/11/2017 CLINICAL DATA:  Altered mental status. EXAM: PORTABLE CHEST 1 VIEW COMPARISON:  05/18/2013 FINDINGS: Enlarged cardiac silhouette. Calcific atherosclerotic disease of the aorta. Mediastinal contours appear intact. Hypoinflation of the lungs. There is no evidence of focal airspace consolidation, pleural effusion or pneumothorax. Mild prominence of the interstitium. Osseous structures are without acute abnormality. Soft tissues are grossly normal. Shock pads overlies the left thorax. Electronic apparatus seen in the lower thorax. IMPRESSION: Mild prominence of the interstitium may represent pulmonary vascular congestion or may be secondary to hypoinflation of the lungs. Electronically Signed   By: Fidela Salisbury M.D.   On: 10/12/2017 16:15   [place] Ekg Site Rite  Result Date: 10/14/2017 If Site Rite image not attached, placement could not be confirmed due to current cardiac rhythm.   Consults: Treatment Team:  Hermelinda Dellen, DO Paraschos, Alexander, MD   Subjective:    Overnight Issues: No significant issues overnight. Perhaps some subtle improvement in mental status. Remains on dopamine. Status post PICC line placement  Objective:  Vital signs for last 24 hours: Temp:  [96.6 F (35.9 C)-97.9 F (36.6 C)] 97 F (36.1 C) (08/19 0800) Pulse Rate:  [65-88] 77 (08/19 0800) Resp:  [8-21] 12 (08/19 0800) BP: (56-229)/(28-202) 101/48 (08/19  0800) SpO2:  [88 %-98 %] 98 % (08/19 0800)  Intake/Output from previous day: 08/18 0701 - 08/19 0700 In: 1512.9 [I.V.:1212.9; IV Piggyback:300] Out: 700 [Urine:700]  Intake/Output this shift: Total I/O In: -  Out: 125 [Urine:125]  Vent settings for last 24 hours:    Physical Exam:  General: Appears a acutely ill looking Neuro: Somnolent, unable to follow commands, withdraws to pain HEENT: PERRLA, mild JVD, trachea midline Cardiovascular: Apical pulse irregular, S1-S2, no murmur regurg or gallop, +2 pulses bilaterally, +3 edema from bilateral lower extremities up to bilateral upper extremities-severe anasarca Lungs: Respirations mildly labored, bilateral breath sounds, diminished in the bases, fine crackles in the bases, no wheezing Abdomen: Obese, normal bowel sounds in all 4 quadrants Musculoskeletal: Positive range of motion in upper and lower extremities no joint swelling Skin: Warm, with  diffuse swelling consistent with anasarca  Assessment/Plan:   Bradycardia and hypotension. Felt to be secondary to cardiac meds. Still continues to need dopamine, status post PICC line placement.  Patient was also hypothermic and being covered empirically with vancomycin and cefepime for possible sepsis. TSH and cortisol levels were stable. Will continue vancomycin as patient's MRSA screen is positive  Hyponatremia. Will replace with saline  Renal failure. Patient's BUN is 113 and creatinine is 3.22. Most likely reflects baseline renal insufficiency with hypoperfusion and ATN on top. Will follow closely, avoiding nephrotoxic agents  Patient is a DO NOT RESUSCITATE, will continue to outline goals of care with family   Critical Care Total Time 40 minutes  Lyndsay Talamante 10/15/2017  *Care during the described time interval was provided by me and/or other providers on the critical care team.  I have reviewed this patient's available data, including medical history, events of note, physical  examination and test results as part of my evaluation.

## 2017-10-15 NOTE — Progress Notes (Signed)
PULMONARY / CRITICAL CARE MEDICINE   Name: Madeline Cunningham MRN: [id number] DOB: 05-11-1929    ADMISSION DATE:  10/11/2017   CONSULTATION DATE:  10/06/2017  REFERRING MD: Dr. Leslye Peer  Reason: Hypotension, bradycardia and anasarca  HISTORY OF PRESENT ILLNESS: 84 year old female with medical history as indicated below, on Eliquis for atrial fibrillation, who presented to the ED with acute change in mental status.  History is obtained from ED records and from patient's family as her mentation continues to wax and wane.  EMS was called because patient's mental status got worse over the past 2 weeks.  When EMS arrived, patient's heart rate was in the 30s and she was hypotensive.  She takes metoprolol 200 mg p.o. nightly and diltiazem 120 mg daily.  Her preliminary work-up in the ED showed a creatinine of 2.71, BNP of 651, and her chest x-ray showed mild interstitial pulmonary vascular congestion.  She was started on broad-spectrum antibiotics as well as dopamine.  She is being admitted to the ICU for further management  SUBJECTIVE: Heart rate and blood pressure remained stable on dopamine.  She denies hallucinating.  Temperature has improved currently 97.5.  Urine output remains marginal improving.  No other acute issues overnight  VITAL SIGNS: BP 104/77   Pulse 72   Temp (!) 97.3 F (36.3 C)   Resp (!) 9   Wt 118.9 kg   SpO2 95%   BMI 43.62 kg/m   HEMODYNAMICS:    VENTILATOR SETTINGS:    INTAKE / OUTPUT: I/O last 3 completed shifts: In: 5221.7 [IV Piggyback:5221.7] Out: 400 [Urine:400]  PHYSICAL EXAMINATION: General: Appears a acutely ill looking Neuro: Somnolent, unable to follow commands, withdraws to pain HEENT: PERRLA, mild JVD, trachea midline Cardiovascular: Apical pulse irregular, S1-S2, no murmur regurg or gallop, +2 pulses bilaterally, +3 edema from bilateral lower extremities up to bilateral upper extremities-severe anasarca Lungs: Respirations mildly labored, bilateral  breath sounds, diminished in the bases, fine crackles in the bases, no wheezing Abdomen: Obese, normal bowel sounds in all 4 quadrants Musculoskeletal: Positive range of motion in upper and lower extremities no joint swelling Skin: Warm, with diffuse swelling consistent with anasarca  LABS:  BMET Recent Labs  Lab 10/26/2017 1534 10/02/2017 2104 10/14/17 0425  NA 132* 132* 134*  K 4.9 4.6 5.1  CL 99 104 105  CO2 21* 18* 20*  BUN 112* 114* 113*  CREATININE 2.92* 2.71* 2.92*  GLUCOSE 126* 151* 100*    Electrolytes Recent Labs  Lab 10/09/2017 1534 10/23/2017 2104 10/14/17 0425  CALCIUM 8.4* 8.2* 8.4*  MG  --  3.1*  --   PHOS  --  6.9*  --     CBC Recent Labs  Lab 10/15/2017 1557 10/14/17 0425  WBC 4.9 9.9  HGB 11.8* 12.7  HCT 36.4 39.8  PLT 144* 173    Coag's Recent Labs  Lab 10/03/2017 1534  INR 1.33    Sepsis Markers Recent Labs  Lab 10/17/2017 1557 10/15/2017 1610 10/12/2017 1749 10/14/17 0425  LATICACIDVEN 2.0*  --  1.4  --   PROCALCITON  --  0.20  --  1.02    ABG No results for input(s): PHART, PCO2ART, PO2ART in the last 168 hours.  Liver Enzymes Recent Labs  Lab 10/02/2017 1534 10/03/2017 2104  AST 31 30  ALT 23 24  ALKPHOS 166* 181*  BILITOT 1.2 1.2  ALBUMIN 2.8* 2.8*    Cardiac Enzymes Recent Labs  Lab 10/14/2017 1534  TROPONINI <0.03    Glucose Recent  Labs  Lab 10/23/2017 2027 10/14/17 1153 10/14/17 2006 10/14/17 2358  GLUCAP 108* 74 122* 117*    Imaging Dg Chest Port 1 View  Result Date: 10/14/2017 CLINICAL DATA:  Central line placement. EXAM: PORTABLE CHEST 1 VIEW COMPARISON:  10/27/2017 FINDINGS: Right internal jugular line is difficult to follow centrally. May terminate over the superior caval/atrial junction or high right atrium. Moderate cardiomegaly. Atherosclerosis in the transverse aorta. No right and no definite left pleural effusion. No pneumothorax. No congestive failure. Increased density at the left lung base. IMPRESSION:  Right internal jugular line tip is difficult to follow. Favored to terminate at the superior caval/atrial junction or high right atrium. Consider repeat frontal radiograph with overpenetration. No pneumothorax. Cardiomegaly without congestive failure. Increased density projecting over the left lung base is favored to be due to overlying soft tissues. Electronically Signed   By: Abigail Miyamoto M.D.   On: 10/14/2017 13:13   [place] Ekg Site Rite  Result Date: 10/14/2017 If Site Rite image not attached, placement could not be confirmed due to current cardiac rhythm.    STUDIES:  Last echo 08/02/2015 showed an EF of greater than 55%  CULTURES: Blood cultures x2 Urine culture  ANTIBIOTICS: Vancomycin Cefepime  SIGNIFICANT EVENTS: 10/19/2017: Admitted  LINES/TUBES: Peripheral IVs  DISCUSSION: 84 year old female presenting with what appears to be cardiorenal syndrome, bradycardia, hypotension, and acute on chronic renal failure  ASSESSMENT  Acute encephalopathy Bradycardia Hypothermia Hypotension Anasarca Acute on chronic renal failure Dementia with hallucinations  PLAN Continue dopamine infusion and titrate to maintain mean arterial blood pressure 65 and above Follow-up cultures Continue antibiotics Trend procalcitonin and adjust antibiotics as needed Cardiology consult Follow-up blood cultures Trend creatinine N.p.o. until mental status improves Geodon 10 mg IM x1 now EKG in the morning to assess QTC Continue D5 half-normal saline at 50 mL's per hour Monitor blood glucose level every 8 hours GI and DVT prophylaxis  FAMILY  - Updates: Family at bedside.  Will update when available   Magdalene S. [facility] ANP-BC Pulmonary and Critical Care Medicine [facility] Pager [phone] or [phone]  NB: This document was prepared using Dragon voice recognition software and may include unintentional dictation errors.   10/15/2017, 1:32 AM

## 2017-10-15 NOTE — Progress Notes (Addendum)
Transferred to 123 via bed after patient cleaned of stool. Still weeping from all extremities. Blood pressure and urine output decreased from 1200 until present. Report given to [facility] RN on 1 C. Glasses and upper dentures and denture brush sent to 1C with patient. No other personal belongings in patients room.

## 2017-10-15 NOTE — Plan of Care (Signed)

## 2017-10-16 DIAGNOSIS — Z515 Encounter for palliative care: Secondary | ICD-10-CM

## 2017-10-16 DIAGNOSIS — A419 Sepsis, unspecified organism: Principal | ICD-10-CM

## 2017-10-16 DIAGNOSIS — R579 Shock, unspecified: Secondary | ICD-10-CM

## 2017-10-16 DIAGNOSIS — N179 Acute kidney failure, unspecified: Secondary | ICD-10-CM

## 2017-10-16 DIAGNOSIS — F0390 Unspecified dementia without behavioral disturbance: Secondary | ICD-10-CM

## 2017-10-16 LAB — CBC
HEMATOCRIT: 35.9 % (ref 35.0–47.0)
Hemoglobin: 11.3 g/dL — ABNORMAL LOW (ref 12.0–16.0)
MCH: 25 pg — AB (ref 26.0–34.0)
MCHC: 31.4 g/dL — AB (ref 32.0–36.0)
MCV: 79.5 fL — ABNORMAL LOW (ref 80.0–100.0)
Platelets: 126 10*3/uL — ABNORMAL LOW (ref 150–440)
RBC: 4.51 MIL/uL (ref 3.80–5.20)
RDW: 20.7 % — AB (ref 11.5–14.5)
WBC: 6.5 10*3/uL (ref 3.6–11.0)

## 2017-10-16 LAB — BASIC METABOLIC PANEL
Anion gap: 8 (ref 5–15)
BUN: 110 mg/dL — AB (ref 8–23)
CALCIUM: 8.3 mg/dL — AB (ref 8.9–10.3)
CO2: 20 mmol/L — ABNORMAL LOW (ref 22–32)
Chloride: 106 mmol/L (ref 98–111)
Creatinine, Ser: 3.33 mg/dL — ABNORMAL HIGH (ref 0.44–1.00)
GFR calc Af Amer: 13 mL/min — ABNORMAL LOW (ref >60)
GFR, EST NON AFRICAN AMERICAN: 12 mL/min — AB (ref >60)
GLUCOSE: 86 mg/dL (ref 70–99)
Potassium: 4.3 mmol/L (ref 3.5–5.1)
Sodium: 134 mmol/L — ABNORMAL LOW (ref 135–145)

## 2017-10-16 MED ORDER — GLYCOPYRROLATE 0.2 MG/ML IJ SOLN
0.2000 mg | INTRAMUSCULAR | Status: DC | PRN
  Administered 2017-10-16 – 2017-10-17 (×3): 0.2 mg via INTRAVENOUS
  Filled 2017-10-16 (×4): qty 1

## 2017-10-16 MED ORDER — MORPHINE SULFATE (PF) 2 MG/ML IV SOLN
2.0000 mg | INTRAVENOUS | Status: AC
  Administered 2017-10-16: 2 mg via INTRAVENOUS
  Filled 2017-10-16: qty 1

## 2017-10-16 MED ORDER — MORPHINE 100MG IN NS 100ML (1MG/ML) PREMIX INFUSION
2.0000 mg/h | INTRAVENOUS | Status: DC
  Administered 2017-10-16: 2 mg/h via INTRAVENOUS
  Administered 2017-10-17: 20:00:00 6 mg/h via INTRAVENOUS
  Administered 2017-10-18: 10 mg/h via INTRAVENOUS
  Filled 2017-10-16 (×2): qty 100

## 2017-10-16 MED ORDER — LORAZEPAM 2 MG/ML IJ SOLN
0.5000 mg | Freq: Four times a day (QID) | INTRAMUSCULAR | Status: DC | PRN
  Administered 2017-10-17: 0.5 mg via INTRAVENOUS
  Filled 2017-10-16: qty 1

## 2017-10-16 MED ORDER — FENTANYL CITRATE (PF) 100 MCG/2ML IJ SOLN: 50.0000 ug | INTRAMUSCULAR | Status: DC | PRN

## 2017-10-16 MED ORDER — LORAZEPAM 0.5 MG PO TABS: 0.5000 mg | ORAL_TABLET | Freq: Four times a day (QID) | ORAL | Status: DC | PRN

## 2017-10-16 NOTE — Progress Notes (Signed)
Madeline Cunningham at [place] NAME: Madeline Cunningham    MR#:  [id number]  DATE OF BIRTH:  [birth year]  SUBJECTIVE:  States that she is doing fine this morning. Denies chest pain, shortness of breath, and abdominal pain. No concerns. REVIEW OF SYSTEMS:  Review of Systems  Constitutional: Negative for chills and fever.  HENT: Negative for congestion and sore throat.   Eyes: Negative for blurred vision and double vision.  Respiratory: Negative for cough and shortness of breath.   Cardiovascular: Negative for chest pain and palpitations.  Gastrointestinal: Negative for nausea and vomiting.  Genitourinary: Negative for dysuria and frequency.  Musculoskeletal: Negative for neck pain.  Neurological: Negative for dizziness and headaches.    DRUG ALLERGIES:   Allergies  Allergen Reactions  . Promethazine Anaphylaxis  . Prempro [Conj Estrog-Medroxyprogest Ace] Other (See Comments)    Pin in my breast  . Evista [Raloxifene] Other (See Comments)    Breast pain  . Zoloft [Sertraline Hcl] Other (See Comments)    Pt does not remember what type of reaction.   VITALS:  Blood pressure (!) 104/52, pulse 83, temperature (!) 95.4 F (35.2 C), resp. rate 14, weight 118.9 kg, SpO2 91 %. PHYSICAL EXAMINATION:  Physical Exam  Constitutional: She appears well-developed and well-nourished.  HENT:  Head: Normocephalic.  Mouth/Throat: Oropharynx is clear and moist.  Eyes: Pupils are equal, round, and reactive to light. Conjunctivae and EOM are normal. No scleral icterus.  Neck: Neck supple. No JVD present. No tracheal deviation present.  Cardiovascular: Normal rate, regular rhythm and normal heart sounds. Exam reveals no gallop.  No murmur heard. Pulmonary/Chest: Effort normal and breath sounds normal. No respiratory distress. She has no wheezes. She has no rales.  Abdominal: Soft. Bowel sounds are normal. She exhibits no distension. There is no tenderness. There is  no rebound.  Musculoskeletal: She exhibits edema. She exhibits no tenderness.  Neurological: She is alert.  Does not answer questions of orientation  Skin: No rash noted. No erythema.  Psychiatric: She has a normal mood and affect.   LABORATORY PANEL:  Female CBC Recent Labs  Lab 10/16/17 0530  WBC 6.5  HGB 11.3*  HCT 35.9  PLT 126*   ------------------------------------------------------------------------------------------------------------------ Chemistries  Recent Labs  Lab 09/28/2017 2104  10/15/17 0452 10/16/17 0530  NA 132*   < > 132* 134*  K 4.6   < > 4.9 4.3  CL 104   < > 105 106  CO2 18*   < > 18* 20*  GLUCOSE 151*   < > 211* 86  BUN 114*   < > 113* 110*  CREATININE 2.71*   < > 3.22* 3.33*  CALCIUM 8.2*   < > 8.2* 8.3*  MG 3.1*  --  2.9*  --   AST 30  --   --   --   ALT 24  --   --   --   ALKPHOS 181*  --   --   --   BILITOT 1.2  --   --   --    < > = values in this interval not displayed.   RADIOLOGY:  No results found. ASSESSMENT AND PLAN:   Hypotension/bradycardia/hypothermia- sepsis ruled out with negative blood cultures, urine cultures, and CXR. May be cardiac in origin. TSH and cortisol normal. - previously on vancomycin and cefepime, which was discontinued 8/19 - holding cardizem and metoprolol - family does not want to escalate care at this point  and are leaning towards full comfort care - if they decide they do not want to pursue full comfort care, would consider repeating CXR and obtaining ECHO - palliative care consult    Acute kidney injury due to ATN, likely from hypotension- worsening. Cr increased from 2.92 > 3.22 > 3.33 - will hold off on nephrology consult, as family is considering full comfort care  Chronic atrial fibrillation and DVT - Continue Eliquis  Dementia. - continue geodon  All the records are reviewed and case discussed with Care Management/Social Worker. Management plans discussed with the patient, family and they are in  agreement.  CODE STATUS: DNR  TOTAL TIME TAKING CARE OF THIS PATIENT: 35 minutes.   More than 50% of the time was spent in counseling/coordination of care: YES  POSSIBLE D/C IN 2-3 DAYS, DEPENDING ON CLINICAL CONDITION.   Berna Spare Loyce Klasen M.D on 10/16/2017 at 2:52 PM  Between 7am to 6pm - Pager [phone]  After 6pm go to [email or web address] - [employer] [facility]

## 2017-10-16 NOTE — Consult Note (Signed)
Consultation Note Date: 10/16/17  Patient Name: Madeline Cunningham  DOB: 1930-01-28  MRN: [id number]  Age / Sex: 84 y.o., female  PCP: Venia Carbon, MD Referring Physician: Sela Hua, MD  Reason for Consultation: Establishing goals of care  HPI/Patient Profile: 84 y.o. female  with past medical history of dementia, COPD, CAD, afib, OSA, asthma, and arthritis admitted on 10/23/2017 with altered mental status and low blood pressure. When EMS arrived to SNF, patient's HR was 30's and she was hypotensive. In ED, creatinine 2.71, BNP 651, and chest xray revealed mild interstitial pulmonary vascular congestion. Patient started on broad-spectrum antibiotics and dopamine. Admitted to ICU. Kidney functions continued to worsen. PCCM discussed with family, who understand she is not a candidate for dialysis. Dopamine was discontinued with plan not to re-start if she declined. Palliative medicine consultation for goals of care.   Clinical Assessment and Goals of Care:  I have reviewed medical records, discussed with care team, and met with daughter [name]), son Charlotte Crumb), and DIL in conference room to discuss diagnosis, prognosis, GOC, EOL wishes, disposition and options.  Introduced Palliative Medicine as specialized medical care for people living with serious illness. It focuses on providing relief from the symptoms and stress of a serious illness.  We discussed a brief life review of the patient. Married to 4yo husband for 37 years! They have 4 children (two deceased). She has been at memory care unit for about 6 months since dementia has progressed.   Discussed events leading up to hospitalization and course of hospital diagnoses and interventions. Discussed disease trajectory of dementia. [name] and [place] recall their conversation with Dr. Jefferson Fuel in the ICU for which their father was present. The patient's husband  spoke of her wishes against heroic measures at EOL including resuscitation, life support, or feeding tube. They understand she is not a candidate for dialysis interventions. The patient's husband spoke of wanting "comfort" for her. Children respectfully agree with his decision.   The difference between aggressive medical intervention and comfort care was considered in light of the patient's goals of care. Educated on comfort measures only with goal of comfort, peace, and dignity at EOL. Educated on utilizing medications as needed for symptoms. Family wishes for comfort focused care but also requesting for labs to be continued while she is hospitalized since she has a central line. They wish to continue to monitor kidney functions while she is here.   Introduced outpatient hospice options. Family speaks of her almost passing away during this hospitalization, and thinking she would not leave the hospital. I did explain that if she remains awake, alert, communicating, eating, and with stable vitals that she would be stable for transfer out of the hospital with initiation of hospice services. [name] shares a fear that past individuals she has known that go to hospice, "die within 2 days." She understands her mother's poor prognosis and focus on comfort, but also feels she may pass sooner if discharged to hospice. Discussed hospice at [facility] as well.  [name] and Charlotte Crumb share that their mother has been awake and alert the last two days. They are thrilled that she is remembering family members and sharing that she loves them. The patient did ask [name] if it was ok for her "to go" (to heaven) yesterday, for which [name] told her it was ok. Discussed EOL expectations and surge of energy that can sometimes occur at EOL.   Therapeutic listening as family shares many stories of their mother and father. Very loving, spiritual family. Emotional/spiritual support provided.   Questions and concerns were addressed.  Hard  Choices booklet left for review. PMT contact information given.    SUMMARY OF RECOMMENDATIONS    DNR/DNI. Adult children confirm their father's decision (yesterday during PCCM conversation) against heroic interventions and for focus on comfort measures.   Family understands she is not a candidate for dialysis if her kidneys should continue to worsen. Family does wish for her labs to continue to be drawn during hospitalization, since she has central line.   Symptom management medications initiated.   Introduced hospice philosophy and options. Family considering hospice options but not ready to make a decision today.  Educated on EOL expectations and possible EOL surge of energy.   PMT will follow.   Code Status/Advance Care Planning:  DNR  Symptom Management:   Fentanyl 27mg q2h prn pain/dyspnea/air hunger  Ativan 0.570mIV q6h prn anxiety  Robinul 0.52m16mV q4h prn secretions  Palliative Prophylaxis:   Aspiration, Delirium Protocol, Frequent Pain Assessment, Oral Care and Turn Reposition  Additional Recommendations (Limitations, Scope, Preferences):  DNR/DNI. Comfort measures. Family requesting to continue labs while hospitalized.   Psycho-social/Spiritual:   Desire for further Chaplaincy support:yes  Additional Recommendations: Caregiving  Support/Resources, Compassionate Wean Education and Education on Hospice  Prognosis:   Poor prognosis with AKI with ATN, worsening renal function not a candidate for dialysis, hypotension, and baseline dementia  Discharge Planning: To Be Determined      Primary Diagnoses: Present on Admission: . [place]is ([facility] have reviewed the medical record, interviewed the patient and family, and examined the patient. The following aspects are pertinent.  Past Medical History:  Diagnosis Date  . Arthritis   . Asthma   . Atrial fibrillation, unspecified   . Cancer (HCC)    rt side  . COPD (chronic obstructive pulmonary disease)  ([place] . Coronary artery disease   . Depression   . Dysrhythmia    A-fib  . Sleep apnea    Social History   Socioeconomic History  . Marital status: Married    Spouse name: Not on file  . Number of children: Not on file  . Years of education: Not on file  . Highest education level: Not on file  Occupational History  . Not on file  Social Needs  . Financial resource strain: Not on file  . Food insecurity:    Worry: Not on file    Inability: Not on file  . Transportation needs:    Medical: Not on file    Non-medical: Not on file  Tobacco Use  . Smoking status: Former Smoker    Last attempt to quit: 04/20/1975    Years since quitting: 42.5  . Smokeless tobacco: Never Used  Substance and Sexual Activity  . Alcohol use: No  . Drug use: No  . Sexual activity: Not on file  Lifestyle  . Physical activity:    Days per week: Not on file    Minutes per  session: Not on file  . Stress: Not on file  Relationships  . Social connections:    Talks on phone: Not on file    Gets together: Not on file    Attends religious service: Not on file    Active member of club or organization: Not on file    Attends meetings of clubs or organizations: Not on file    Relationship status: Not on file  Other Topics Concern  . Not on file  Social History Narrative  . Not on file   Family History  Problem Relation Age of Onset  . CVA Mother   . CVA Father   . CAD Father   . Varicose Veins Neg Hx   . Vision loss Neg Hx    Scheduled Meds: . busPIRone  10 mg Oral BID  . FLUoxetine  10 mg Oral BH-q7a  . mupirocin ointment  1 application Nasal BID  . nystatin  100,000 Units Topical Q8H   Continuous Infusions: . morphine 4 mg/hr (10/17/17 0324)   PRN Meds:.acetaminophen **OR** acetaminophen, glycopyrrolate, LORazepam, LORazepam, ondansetron **OR** ondansetron (ZOFRAN) IV Medications Prior to Admission:  Prior to Admission medications   Medication Sig Start Date End Date Taking?  Authorizing Provider  acetaminophen (TYLENOL) 500 MG tablet Take 500 mg by mouth daily as needed for mild pain.   Yes [provider]  Amino Acids-Protein Hydrolys (FEEDING SUPPLEMENT, PRO-STAT SUGAR FREE 64,) LIQD Take 30 mLs by mouth every evening.   Yes [provider]  busPIRone (BUSPAR) 10 MG tablet Take 10 mg by mouth 2 (two) times daily.   Yes [provider]  diltiazem (TIAZAC) 240 MG 24 hr capsule Take 120 mg by mouth daily with lunch.   Yes [provider]  ELIQUIS 2.5 MG TABS tablet Take 2.5 mg by mouth 2 (two) times daily.  05/28/17  Yes [provider]  FLUoxetine (PROZAC) 10 MG capsule Take 10 mg by mouth every morning.    Yes [provider]  furosemide (LASIX) 40 MG tablet Take 1 tablet (40MG) by mouth daily and 1 additional tablet (40MG) daily as needed for weight gain over 240lbs   Yes [provider]  gabapentin (NEURONTIN) 100 MG capsule Take 200 mg by mouth 3 (three) times daily.   Yes [provider]  HYDROcodone-acetaminophen (NORCO) 5-325 MG tablet Take 1 tablet by mouth every 6 (six) hours as needed for moderate pain. 05/11/17  Yes Schnier, Dolores Lory, MD  LORazepam (ATIVAN) 0.5 MG tablet Take 1 tablet (0.5 mg total) by mouth every 12 (twelve) hours as needed for anxiety. 05/11/17  Yes Schnier, Dolores Lory, MD  metoprolol (TOPROL-XL) 200 MG 24 hr tablet Take 200 mg by mouth at bedtime.   Yes [provider]  nitroGLYCERIN (NITROSTAT) 0.4 MG SL tablet Place 0.4 mg under the tongue every 5 (five) minutes as needed for chest pain.   Yes [provider]  pravastatin (PRAVACHOL) 80 MG tablet Take 80 mg by mouth at bedtime.   Yes [provider]  ropinirole (REQUIP) 5 MG tablet Take 5 mg by mouth at bedtime.   Yes [provider]   Allergies  Allergen Reactions  . Promethazine Anaphylaxis  . Prempro [Conj Estrog-Medroxyprogest Ace] Other (See Comments)    Pin in my breast  .  Evista [Raloxifene] Other (See Comments)    Breast pain  . Zoloft [Sertraline Hcl] Other (See Comments)    Pt does not remember what type of reaction.  Review of Systems  Unable to perform ROS: Dementia   Physical Exam  Constitutional: She is easily aroused. She appears ill.  HENT:  Head: Normocephalic and atraumatic.  Cardiovascular: Regular rhythm.  Pulmonary/Chest: No accessory muscle usage. No tachypnea. No respiratory distress. She has decreased breath sounds.  Abdominal: There is no tenderness.  Neurological: She is easily aroused.  Wakes to voice, disoriented  Skin: Skin is warm and dry.  Dusky, BLE mottling  Psychiatric: Her speech is delayed. Cognition and memory are impaired. She is inattentive.  Nursing note and vitals reviewed.  Vital Signs: BP 133/69 (BP Location: Right Arm)   Pulse (!) 54   Temp (!) 95.4 F (35.2 C)   Resp 18   Wt 118.9 kg   SpO2 93%   BMI 43.62 kg/m  Pain Scale: PAINAD   Pain Score: 0-No pain   SpO2: SpO2: 93 % O2 Device:SpO2: 93 % O2 Flow Rate: .O2 Flow Rate (L/min): 2 L/min  IO: Intake/output summary:   Intake/Output Summary (Last 24 hours) at 10/17/2017 0754 Last data filed at 10/17/2017 0500 Gross per 24 hour  Intake 269.62 ml  Output 500 ml  Net -230.38 ml    LBM: Last BM Date: 10/15/17 Baseline Weight: Weight: 118.9 kg Most recent weight: Weight: 118.9 kg     Palliative Assessment/Data: PPS 20%   Flowsheet Rows     Most Recent Value  Intake Tab  Referral Department  Hospitalist  Unit at Time of Referral  Med/Surg Unit  Palliative Care Primary Diagnosis  Nephrology  Palliative Care Type  New Palliative care  Reason for referral  Clarify Goals of Care  Date first seen by Palliative Care  10/16/17  Clinical Assessment  Palliative Performance Scale Score  20%  Psychosocial & Spiritual Assessment  Palliative Care Outcomes  Patient/Family meeting held?  Yes  Who was at the meeting?  daughter, son, DIL  Palliative  Care Outcomes  Clarified goals of care, Counseled regarding hospice, Provided psychosocial or spiritual support, ACP counseling assistance, Improved pain interventions, Improved non-pain symptom therapy, Provided end of life care assistance, Changed to focus on comfort      Time In: 1130 Time Out: 1300 Time Total: 84mn Greater than 50%  of this time was spent counseling and coordinating care related to the above assessment and plan.  Signed by:  MIhor Dow FNP-C Palliative Medicine Team  Phone: [phone]: [phone]  Please contact Palliative Medicine Team phone at [phone] questions and concerns.  For individual provider: See AShea Evans

## 2017-10-16 NOTE — Progress Notes (Signed)
PMT consult received and chart reviewed. This NP met with patient's daughter, son, and daughter-in-law in family waiting room. Family confirms their father's decision yesterday against heroic measures at EOL and focus on comfort measures. Initiated comfort medications. Family does wish for labs to continued to be drawn from central line while she is hospitalized. Vitals are stable during my visit. Patient may be having surge of energy at [facility], alert, communicating with family and eating. Introduced hospice philosophy and role. Family understands diagnoses and poor prognosis but also not ready to make decision regarding hospice services. Will follow.   Full note to follow.   NO CHARGE  Ihor Dow, FNP-C Palliative Medicine Team  Phone: [phone] Fax: [phone]

## 2017-10-17 DIAGNOSIS — R0600 Dyspnea, unspecified: Secondary | ICD-10-CM

## 2017-10-17 DIAGNOSIS — Z515 Encounter for palliative care: Secondary | ICD-10-CM

## 2017-10-17 DIAGNOSIS — N179 Acute kidney failure, unspecified: Secondary | ICD-10-CM

## 2017-10-17 DIAGNOSIS — R579 Shock, unspecified: Secondary | ICD-10-CM

## 2017-10-17 DIAGNOSIS — K117 Disturbances of salivary secretion: Secondary | ICD-10-CM

## 2017-10-17 DIAGNOSIS — F0390 Unspecified dementia without behavioral disturbance: Secondary | ICD-10-CM

## 2017-10-17 DIAGNOSIS — R001 Bradycardia, unspecified: Secondary | ICD-10-CM

## 2017-10-17 LAB — BASIC METABOLIC PANEL
ANION GAP: 7 (ref 5–15)
BUN: 112 mg/dL — ABNORMAL HIGH (ref 8–23)
CALCIUM: 8.8 mg/dL — AB (ref 8.9–10.3)
CO2: 22 mmol/L (ref 22–32)
Chloride: 106 mmol/L (ref 98–111)
Creatinine, Ser: 3.34 mg/dL — ABNORMAL HIGH (ref 0.44–1.00)
GFR, EST AFRICAN AMERICAN: 13 mL/min — AB (ref >60)
GFR, EST NON AFRICAN AMERICAN: 12 mL/min — AB (ref >60)
Glucose, Bld: 117 mg/dL — ABNORMAL HIGH (ref 70–99)
POTASSIUM: 4.9 mmol/L (ref 3.5–5.1)
Sodium: 135 mmol/L (ref 135–145)

## 2017-10-17 LAB — CBC
HCT: 40.9 % (ref 35.0–47.0)
Hemoglobin: 12.8 g/dL (ref 12.0–16.0)
MCH: 25.2 pg — ABNORMAL LOW (ref 26.0–34.0)
MCHC: 31.3 g/dL — ABNORMAL LOW (ref 32.0–36.0)
MCV: 80.4 fL (ref 80.0–100.0)
Platelets: 164 10*3/uL (ref 150–440)
RBC: 5.09 MIL/uL (ref 3.80–5.20)
RDW: 20.9 % — ABNORMAL HIGH (ref 11.5–14.5)
WBC: 9 10*3/uL (ref 3.6–11.0)

## 2017-10-17 MED ORDER — GLYCOPYRROLATE 0.2 MG/ML IJ SOLN
0.2000 mg | INTRAMUSCULAR | Status: DC
  Administered 2017-10-17 – 2017-10-18 (×3): 0.2 mg via INTRAVENOUS
  Filled 2017-10-17 (×9): qty 1

## 2017-10-17 MED ORDER — MORPHINE BOLUS VIA INFUSION
2.0000 mg | INTRAVENOUS | Status: DC | PRN
  Administered 2017-10-17 (×3): 2 mg via INTRAVENOUS
  Administered 2017-10-17: 23:00:00 3 mg via INTRAVENOUS
  Administered 2017-10-17 – 2017-10-18 (×3): 2 mg via INTRAVENOUS
  Filled 2017-10-17: qty 4

## 2017-10-17 NOTE — Progress Notes (Signed)
Chaplain responded to a page, OR and another page. On the Arist[place] page chap[lain responded to the secretary and said it could wait for the unit chaplain. The Order reg. Com an then another page from NP. Chaplain responded with family ( son, daughter and daugter-in-law) at the bedside . Chaplain read the commendation of the dying and prayer. Husband of the patient entered the  Room and the daughter suggested that he not come and  In and he was escorted to the family room. Chaplain went and visited with him and talked about his 84 years of marriage. Spouse asked if chaplain would pry the "Loprd's Prayer " to the patient. Chaplain did so at which time the unit chaplain entered. Chaplain retrieved a Bible for the family and left with the unit chaplain Jenny Reichmann).   10/17/17 1100  Clinical Encounter Type  Visited With Patient and family together  Visit Type Spiritual support;Patient actively dying  Referral From [place] Needs Grief support

## 2017-10-17 NOTE — Progress Notes (Signed)
Daily Progress Note   Patient Name: Madeline Cunningham       Date: 10/17/2017 DOB: 1929-09-25  Age: 84 y.o. MRN#: [id number] Attending Physician: Sela Hua, MD Primary Care Physician: Venia Carbon, MD Admit Date: 10/15/2017  Reason for Consultation/Follow-up: Establishing goals of care and Terminal Care  Subjective: Patient actively dying. Respirations irregular, agonal. Appears comfortable on morphine infusion. Audible secretions.   Family at beside including patient's son, daughter, and DIL. Family understands she is nearing the end of her life. They speak of agitation/dyspnea last night relieved by initiation of morphine infusion. Educated on EOL expectations and continued medications for symptom management. They are praying that the Reita Cliche takes her soon. Emotional/spiritual support provided. Family requesting visit from chaplain. Therapeutic listening.  Length of Stay: 4  Current Medications: Scheduled Meds:  . glycopyrrolate  0.2 mg Intravenous Q4H  . mupirocin ointment  1 application Nasal BID    Continuous Infusions: . morphine 4 mg/hr (10/17/17 0324)    PRN Meds: acetaminophen **OR** acetaminophen, glycopyrrolate, LORazepam, LORazepam, morphine, ondansetron **OR** ondansetron (ZOFRAN) IV  Physical Exam  HENT:  Head: Normocephalic and atraumatic.  Cardiovascular: Regular rhythm.  Pulmonary/Chest: Apnea and bradypnea noted.  Agonal, irregular respirations with periods of apnea. Audible secretions.   Neurological: She is unresponsive.  Skin:  Dusky, mottling, anasarca  Nursing note and vitals reviewed.          Vital Signs: BP 133/69 (BP Location: Right Arm)   Pulse (!) 54   Temp (!) 95.4 F (35.2 C)   Resp 18   Wt 118.9 kg   SpO2 93%   BMI 43.62 kg/m  SpO2:  SpO2: 93 % O2 Device: O2 Device: Nasal Cannula O2 Flow Rate: O2 Flow Rate (L/min): 2 L/min  Intake/output summary:   Intake/Output Summary (Last 24 hours) at 10/17/2017 1037 Last data filed at 10/17/2017 0500 Gross per 24 hour  Intake 269.62 ml  Output 500 ml  Net -230.38 ml   LBM: Last BM Date: 10/15/17 Baseline Weight: Weight: 118.9 kg Most recent weight: Weight: 118.9 kg       Palliative Assessment/Data: PPS 10%   Flowsheet Rows     Most Recent Value  Intake Tab  Referral Department  Hospitalist  Unit at Time of Referral  Med/Surg Unit  Palliative Care Primary Diagnosis  Nephrology  Palliative Care Type  New Palliative care  Reason for referral  Clarify Goals of Care  Date first seen by Palliative Care  10/16/17  Clinical Assessment  Palliative Performance Scale Score  20%  Psychosocial & Spiritual Assessment  Palliative Care Outcomes  Patient/Family meeting held?  Yes  Who was at the meeting?  daughter, son, DIL  Palliative Care Outcomes  Clarified goals of care, Counseled regarding hospice, Provided psychosocial or spiritual support, ACP counseling assistance, Improved pain interventions, Improved non-pain symptom therapy, Provided end of life care assistance, Changed to focus on comfort      Patient Active Problem List   Diagnosis Date Noted  . Palliative care by specialist   . Terminal care   . Acute renal failure ([place])   . Bradycardia   . Shock ([place])   . Dementia without behavioral disturbance   . Sepsis ([place]) 09/28/2017  . Arterial embolism ([place]) 06/17/2017  . A-fib ([place]) 06/17/2017  . Hyperlipidemia 06/17/2017  . Essential hypertension 06/17/2017  . Ischemic leg 05/05/2017  . 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 05/05/2017    Palliative Care Assessment & Plan   Patient Profile: 84 y.o. female  with past medical history of dementia, COPD, CAD, afib, OSA, asthma, and arthritis admitted on 10/16/2017 with altered mental status  and low blood pressure. When EMS arrived to SNF, patient's HR was 30's and she was hypotensive. In ED, creatinine 2.71, BNP 651, and chest xray revealed mild interstitial pulmonary vascular congestion. Patient started on broad-spectrum antibiotics and dopamine. Admitted to ICU. Kidney functions continued to worsen. PCCM discussed with family, who understand she is not a candidate for dialysis. Dopamine was discontinued with plan not to re-start if she declined. Palliative medicine consultation for goals of care.   Assessment: AKI due to ATN Hypotension Bradycardia Hypothermia Dementia Adult failure to thrive Atrial fibrillation  Hx of DVT  Recommendations/Plan:  Comfort measures only. Patient actively dying. Family understands she may pass at any time.   Continue Morphine infusion for comfort.   Robinul 0.2mg  IV q4h secretions  RN may bolus morphine via infusion 2-4mg  q39min prn pain/dyspnea/air hunger  Chaplain requested by family.   Anticipate hospital death. Minutes-hours.  Goals of Care and Additional Recommendations:  Limitations on Scope of Treatment: Full Comfort Care  Code Status: DNR/DNI   Code Status Orders  (From admission, onward)         Start     Ordered   10/17/2017 1815  Do not attempt resuscitation (DNR)  Continuous    Question Answer Comment  In the event of cardiac or respiratory ARREST Do not call a "code blue"   In the event of cardiac or respiratory ARREST Do not perform Intubation, CPR, defibrillation or ACLS   In the event of cardiac or respiratory ARREST Use medication by any route, position, wound care, and other measures to relive pain and suffering. May use oxygen, suction and manual treatment of airway obstruction as needed for comfort.   Comments nurse may pronounce      10/26/2017 1816        Code Status History    Date Active Date Inactive Code Status Order ID Comments User Context   05/05/2017 1628 05/11/2017 1648 Full Code [id number]   Schnier, Dolores Lory, MD Inpatient       Prognosis:  Patient will pass any time. Likely minutes-hours.  Discharge Planning:  Anticipated Hospital Death  Care plan was discussed with son, daughter, DIL  Thank you for allowing the Palliative Medicine Team to assist in the care of this patient.   Time In: 1030 Time Out: 1055 Total Time 43min Prolonged Time Billed  no      Greater than 50%  of this time was spent counseling and coordinating care related to the above assessment and plan.  Ihor Dow, FNP-C Palliative Medicine Team  Phone: [phone] Fax: [phone]  Please contact Palliative Medicine Team phone at [phone] for questions and concerns.

## 2017-10-17 NOTE — Progress Notes (Signed)
[facility] at [place] NAME: Madeline Cunningham    MR#:  [id number]  DATE OF BIRTH:  January 21, 1930  SUBJECTIVE:   Patient is actively dying this morning. Per family, her breaths have become very spaced out and her feet are cold. They have also noticed some swelling in her arms.  REVIEW OF SYSTEMS:  Review of Systems  Unable to perform ROS: Critical illness   DRUG ALLERGIES:   Allergies  Allergen Reactions  . Promethazine Anaphylaxis  . Prempro [Conj Estrog-Medroxyprogest Ace] Other (See Comments)    Pin in my breast  . Evista [Raloxifene] Other (See Comments)    Breast pain  . Zoloft [Sertraline Hcl] Other (See Comments)    Pt does not remember what type of reaction.   VITALS:  Blood pressure 133/69, pulse (!) 54, temperature (!) 95.4 F (35.2 C), resp. rate 18, weight 118.9 kg, SpO2 93 %. PHYSICAL EXAMINATION:  Physical Exam  Constitutional:  Actively dying, appears comfortable  HENT:  Head: Normocephalic.  Eyes: No scleral icterus.  Eyes closed  Neck: Neck supple. No JVD present.  Cardiovascular: Exam reveals no gallop.  No murmur heard. Bradycardic   Pulmonary/Chest:  Slowed respiratory rate, agonal breathing  Abdominal: Soft.  Musculoskeletal: She exhibits edema. She exhibits no tenderness.  Neurological:  Resting comfortably, does not open eyes  Skin:  Feet are cold to the touch  Psychiatric:  Unable to assess   LABORATORY PANEL:  Female CBC Recent Labs  Lab 10/17/17 0527  WBC 9.0  HGB 12.8  HCT 40.9  PLT 164   ------------------------------------------------------------------------------------------------------------------ Chemistries  Recent Labs  Lab 09/27/2017 2104  10/15/17 0452  10/17/17 0527  NA 132*   < > 132*   < > 135  K 4.6   < > 4.9   < > 4.9  CL 104   < > 105   < > 106  CO2 18*   < > 18*   < > 22  GLUCOSE 151*   < > 211*   < > 117*  BUN 114*   < > 113*   < > 112*  CREATININE 2.71*   < > 3.22*    < > 3.34*  CALCIUM 8.2*   < > 8.2*   < > 8.8*  MG 3.1*  --  2.9*  --   --   AST 30  --   --   --   --   ALT 24  --   --   --   --   ALKPHOS 181*  --   --   --   --   BILITOT 1.2  --   --   --   --    < > = values in this interval not displayed.   RADIOLOGY:  No results found. ASSESSMENT AND PLAN:   Hypotension/bradycardia/hypothermia- sepsis ruled out with negative blood cultures, urine cultures, and CXR. May be cardiac in origin. TSH and cortisol normal. - patient actively dying this morning, now full comfort care - continue morphine drip and robinul - palliative care following for symptom management - unstable for transport, anticipate hospital death  Acute kidney injury due to ATN, likely from hypotension- stabilized - stop checking labs now that patient is full comfort care  Chronic atrial fibrillation and DVT - stop eliquis  Dementia. - stop geodon  All the records are reviewed and case discussed with Care Management/Social Worker. Management plans discussed with the patient, family and  they are in agreement.  CODE STATUS: DNR  TOTAL TIME TAKING CARE OF THIS PATIENT: 32 minutes.   More than 50% of the time was spent in counseling/coordination of care: YES  Anticipate hospital death in the next minutes to hours.   Berna Spare Mayo M.D on 10/17/2017 at 11:33 AM  Between 7am to 6pm - Pager - [phone]  After 6pm go to [email or web address] - [facility]

## 2017-10-17 NOTE — Progress Notes (Signed)
   10/17/17 1115  Clinical Encounter Type  Visited With Family  Visit Type Initial;Spiritual support  Referral From Nurse  Consult/Referral To Chaplain  Spiritual Encounters  Spiritual Needs Prayer;Emotional;Grief support   Ch received a request from the Patient's RN to visit with family. The patient has been made comfort care and the family is aware of the diagnosis and impending death of the patient. Madeline Cunningham shared that he and the patient have been married for 69 years. His daughter-in-law, [name] is with Madeline Cunningham and shares about the love that the patient and Madeline Cunningham have for each other. I asked if I could pray for the family and was permitted to do so. I departed when a security alert was called for another room on 1C. I will follow up as needed.

## 2017-10-18 LAB — CULTURE, BLOOD (ROUTINE X 2)
CULTURE: NO GROWTH
CULTURE: NO GROWTH
SPECIAL REQUESTS: ADEQUATE

## 2017-10-28 NOTE — Discharge Summary (Addendum)
[facility] at [place] NAME: Haylin Camilli    MR#:  [id number]  DATE OF BIRTH:  1929/12/17  DATE OF ADMISSION:  10/27/2017   ADMITTING PHYSICIAN: Loletha Grayer, MD  DATE OF DEATH: 10-23-2017  6:05 AM  PRIMARY CARE PHYSICIAN: Venia Carbon, MD   ADMISSION DIAGNOSIS:  Bradycardia [R00.1] Shock ([place]) [R57.9] Acute renal failure, unspecified acute renal failure type ([employer]) [N17.9] Altered mental status, unspecified altered mental status type [R41.82] DISCHARGE DIAGNOSIS:  Active Problems:   Sepsis ([place])   Palliative care by specialist   Terminal care   Acute renal failure ([place])   Bradycardia   Shock ([place])   Dementia without behavioral disturbance   Increased oropharyngeal secretions   Dyspnea  SECONDARY DIAGNOSIS:   Past Medical History:  Diagnosis Date  . Arthritis   . Asthma   . Atrial fibrillation, unspecified   . Cancer (HCC)    rt side  . COPD (chronic obstructive pulmonary disease) ([place])   . Coronary artery disease   . Depression   . Dysrhythmia    A-fib  . Sleep apnea    HOSPITAL COURSE:   Madeline Cunningham is an 84 year old female with a PMH of chronic a-fib, COPD, CAD, and depression presenting to the ED from memory care unit with AMS, hypotension, bradycardia, and hypothermia. She was started on empiric vanc/cefepime for presumed sepsis. She was also started on pressors for hypotension and was admitted to the ICU. Blood and urine cultures were ultimately negative. Empiric antibiotics were discontinued. CCM discussed goals of care with the family and the decision was made to wean off pressors and to not escalate care. Family eventually made the decision to transition to full comfort care. Palliative care team was consulted. Patient passed away on 2022/10/24 at 0602.  CONSULTS OBTAINED:  Treatment Team:  Isaias Cowman, MD DRUG ALLERGIES:   Allergies  Allergen Reactions  . Promethazine Anaphylaxis  . Prempro [Conj  Estrog-Medroxyprogest Ace] Other (See Comments)    Pin in my breast  . Evista [Raloxifene] Other (See Comments)    Breast pain  . Zoloft [Sertraline Hcl] Other (See Comments)    Pt does not remember what type of reaction.   DISCHARGE CONDITION:  Expired  On the day of Discharge:  VITAL SIGNS:  Blood pressure 133/69, pulse (!) 54, temperature (!) 95.4 F (35.2 C), resp. rate 18, weight 118.9 kg, SpO2 93 %. PHYSICAL EXAMINATION:  Constitutional:  Actively dying, appears comfortable  HENT:  Head: Normocephalic.  Eyes: No scleral icterus.  Eyes closed  Neck: Neck supple. No JVD present.  Cardiovascular: Exam reveals no gallop.  No murmur heard. Bradycardic   Pulmonary/Chest:  Slowed respiratory rate, agonal breathing  Abdominal: Soft.  Musculoskeletal: She exhibits edema. She exhibits no tenderness.  Neurological:  Resting comfortably, does not open eyes  Skin:  Feet are cold to the touch  Psychiatric:  Unable to assess  DATA REVIEW:   CBC Recent Labs  Lab 10/17/17 0527  WBC 9.0  HGB 12.8  HCT 40.9  PLT 164    Chemistries  Recent Labs  Lab 09/27/2017 2102-05-29  10/15/17 0452  10/17/17 0527  NA 132*   < > 132*   < > 135  K 4.6   < > 4.9   < > 4.9  CL 104   < > 105   < > 106  CO2 18*   < > 18*   < > 22  GLUCOSE 151*   < >  211*   < > 117*  BUN 114*   < > 113*   < > 112*  CREATININE 2.71*   < > 3.22*   < > 3.34*  CALCIUM 8.2*   < > 8.2*   < > 8.8*  MG 3.1*  --  2.9*  --   --   AST 30  --   --   --   --   ALT 24  --   --   --   --   ALKPHOS 181*  --   --   --   --   BILITOT 1.2  --   --   --   --    < > = values in this interval not displayed.     Microbiology Results  Results for orders placed or performed during the hospital encounter of 10/02/2017  Blood Culture (routine x 2)     Status: None   Collection Time: 10/25/2017  3:47 PM  Result Value Ref Range Status   Specimen Description BLOOD R HAND  Final   Special Requests   Final    BOTTLES DRAWN AEROBIC  AND ANAEROBIC Blood Culture adequate volume   Culture   Final    NO GROWTH 5 DAYS Performed at [facility], [address] [place], [place] [address]    Report Status November 11, 2017 FINAL  Final  Urine culture     Status: None   Collection Time: 10/08/2017  3:57 PM  Result Value Ref Range Status   Specimen Description   Final    URINE, RANDOM Performed at [facility], [address] [place], [place] [address]    Special Requests   Final    NONE Performed at [facility], [address], [place], [place] [address]    Culture   Final    NO GROWTH Performed at [facility], [place] [address] [place], [place] [address]    Report Status 10/14/2017 FINAL  Final  MRSA PCR Screening     Status: Abnormal   Collection Time: 10/25/2017  9:09 PM  Result Value Ref Range Status   MRSA by PCR POSITIVE (A) NEGATIVE Final    Comment:        The GeneXpert MRSA Assay (FDA approved for NASAL specimens only), is one component of a comprehensive MRSA colonization surveillance program. It is not intended to diagnose MRSA infection nor to guide or monitor treatment for MRSA infections. RESULT CALLED TO, READ BACK BY AND VERIFIED WITH: CYTNHIA VASQUEZ ON 10/14/17 AT 0014 [facility] Performed at [facility], [address] [place], [place] [address]   Blood Culture (routine x 2)     Status: None   Collection Time: 10/01/2017 11:31 PM  Result Value Ref Range Status   Specimen Description BLOOD LEFT FOREARM  Final   Special Requests   Final    BOTTLES DRAWN AEROBIC AND ANAEROBIC Blood Culture results may not be optimal due to an excessive volume of blood received in culture bottles   Culture   Final    NO GROWTH 5 DAYS Performed at [facility], [address] [place], [place] [address]    Report Status 2017/11/11 FINAL  Final    RADIOLOGY:  No results found.   Management plans discussed with the patient, family and they are in  agreement.  CODE STATUS: Prior   TOTAL TIME TAKING CARE OF THIS PATIENT: 30 minutes.    Berna Spare Mayo M.D on Nov 11, 2017  at 3:02 PM  Between 7am to 6pm - Pager - [phone]  After 6pm go to [email or web address] - Proofreader  [facility]  Office  [phone]  CC: Primary care physician; Venia Carbon, MD   Note: This dictation was prepared with Dragon dictation along with smaller phrase technology. Any transcriptional errors that result from this process are unintentional.

## 2017-10-28 NOTE — Progress Notes (Signed)
While exiting a previous RR Nurse was summoned into room to check on the Pt for shadow breathing. Nurse said there is still a faint heartbeat. Chaplain was available to offer continued support.    11-02-2017 0600  Clinical Encounter Type  Visited With Family  Visit Type Spiritual support  Referral From Chaplain  Spiritual Encounters  Spiritual Needs Grief support

## 2017-10-28 NOTE — Progress Notes (Signed)
   11-04-2017 1005  Clinical Encounter Type  Visited With Other (Comment)  Visit Type Follow-up;Death  Referral From Nurse  Consult/Referral To Chaplain  Spiritual Encounters  Spiritual Needs Prayer   While rounding on 1C, [place] discovered that Madeline Cunningham had died. CH reported to the deceased patient's room and prayed for the family, who had already left. I also offered support to staff as Madeline Cunningham was on comfort care, actively dying, for 2 days.

## 2017-10-28 NOTE — Progress Notes (Signed)
Pnt expired at 0605 with family(son, daughter in law, and daughter) at bedside. MD Rosilyn Mings, [facility] Edwin Cap, and Chaplain notified.   Pronounced by RN Bostyn Kunkler and Aris [place].

## 2017-10-28 DEATH — deceased

## 2019-09-18 IMAGING — CT CT ANGIO EXTREM LOW*L*
2 of 10 series · 9 of 33 positions shown · IV contrast (iopamidol)
Comparison: None

CLINICAL DATA: Loss of feelings in legs.  Evaluate for PAD.

EXAM:
CT ANGIOGRAPHY OF ABDOMINAL AORTA WITH ILIOFEMORAL RUNOFF
TECHNIQUE: Multidetector CT imaging of the abdomen, pelvis and lower
extremities was performed using the standard protocol during bolus
administration of intravenous contrast. Multiplanar CT image
reconstructions and MIPs were obtained to evaluate the vascular
anatomy.
CONTRAST:  125mL 9FNKVZ-[id number] IOPAMIDOL ([id number]) INJECTION 76%

[Series 6: axial arterial upper · axial · arterial · 0.65mm/px · z∈[+736,+1276]mm · 6 of 253 slices shown]
[im 37/253  soft-tissue]
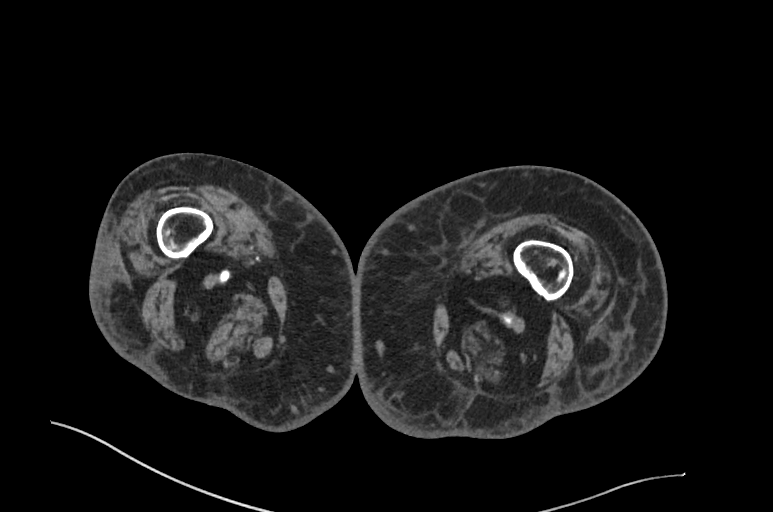
[im 73/253  bone]
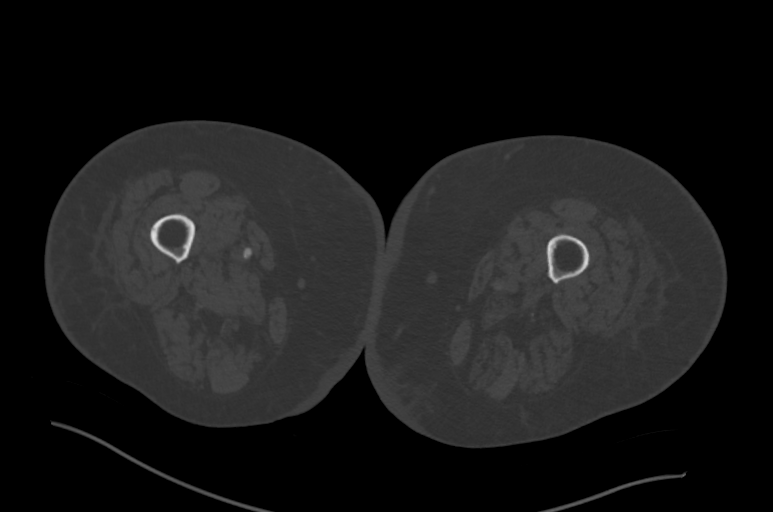
[im 109/253  soft-tissue]
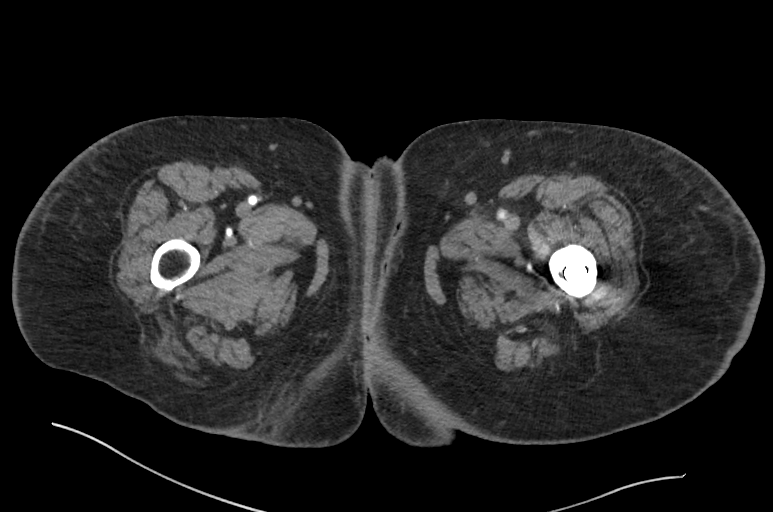
[im 145/253  bone]
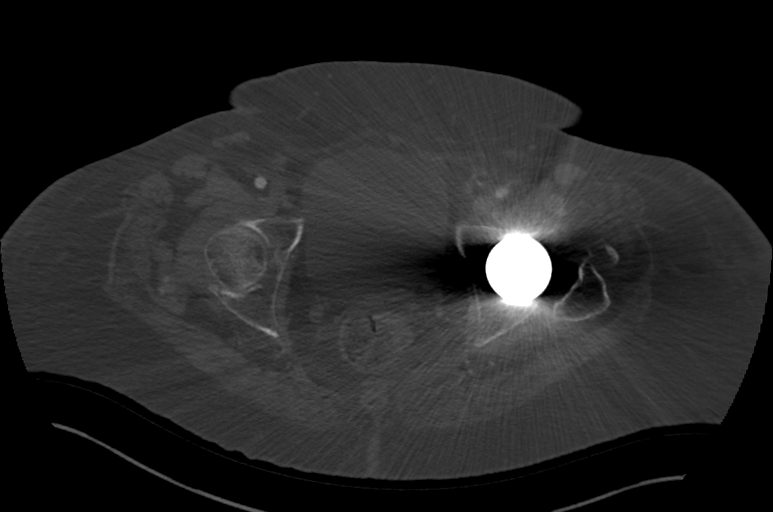
[im 181/253  soft-tissue]
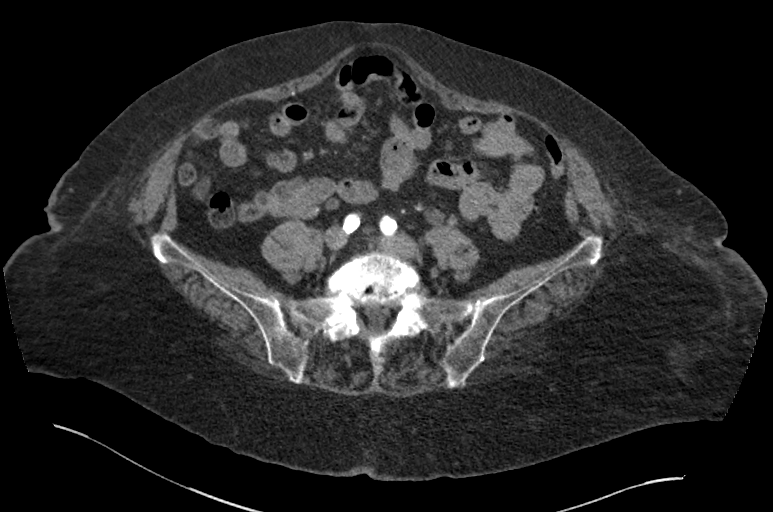
[im 217/253  bone]
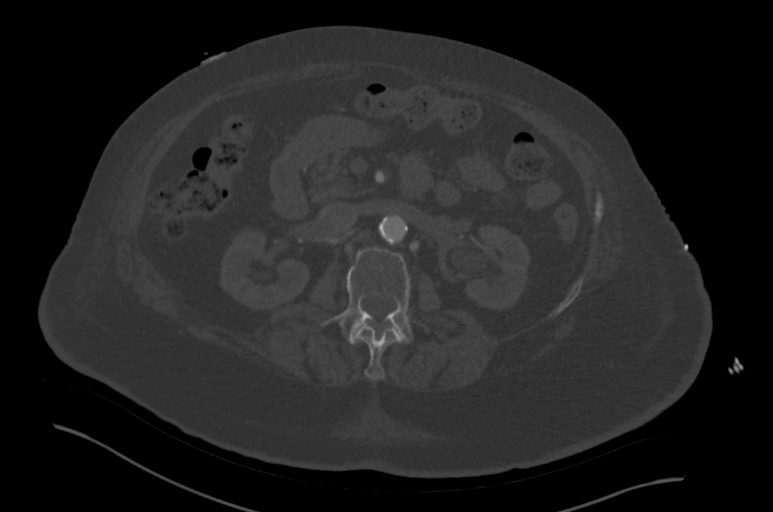

[Series 12: axial arterial lower · axial · arterial · 0.44mm/px · z∈[+194,+428]mm · 3 of 196 slices shown]
[im 40/196  soft-tissue]
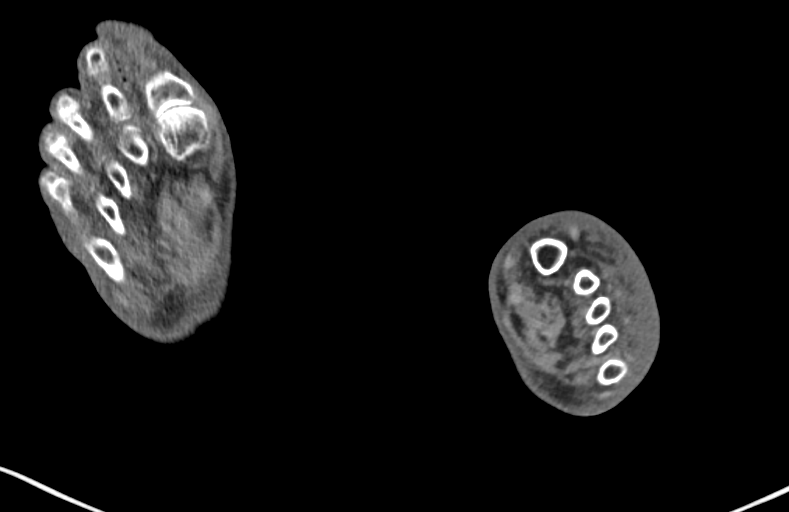
[im 79/196  soft-tissue]
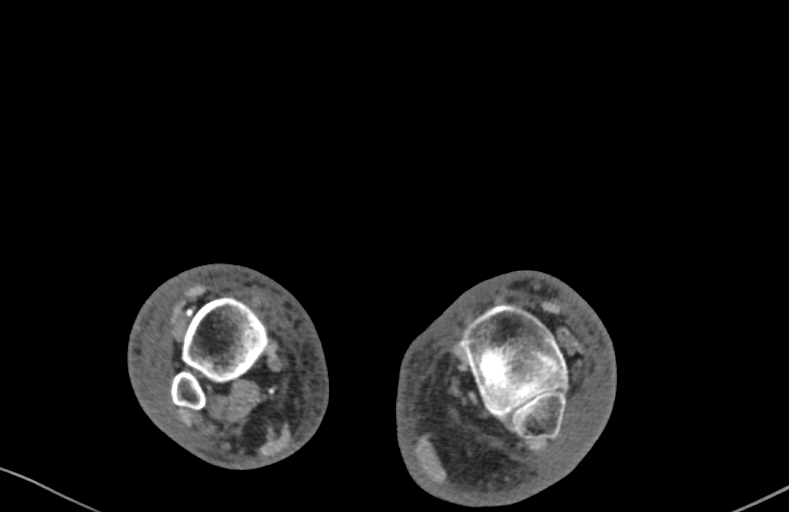
[im 118/196  soft-tissue]
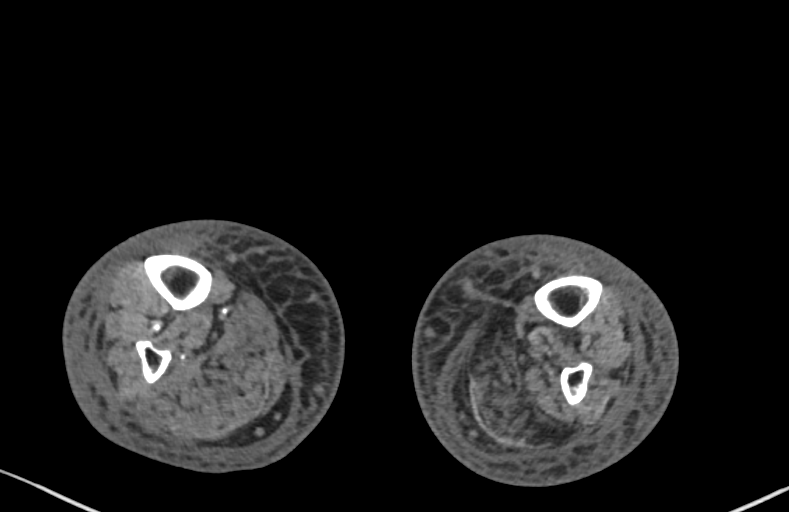

[9 of 33 positions shown; findings below may reference images not displayed]

FINDINGS: VASCULAR

Aorta: Moderate amount of predominantly calcified though slightly
irregular atherosclerotic plaque scattered throughout a normal
caliber abdominal aorta. No abdominal aortic dissection or
periaortic stranding.

Celiac: Minimal amount of atherosclerotic plaque involving the
origin the celiac artery, not resulting in hemodynamically
significant stenosis. Conventional branching pattern.

SMA: Atherosclerotic plaque involving the origin and main trunk of
the SMA (representative image 34, series 6), not resulting in
hemodynamically significant stenosis. A replaced right hepatic
artery arises from the proximal SMA.

Renals: Right-sided renal artery are duplicated. Both renal arteries
are diminutive though appear patent. Moderate to large amount of
mixed calcified and noncalcified atherosclerotic plaque involving
the origin of the left renal artery approaches approximately 50%
luminal narrowing (image 35, series 6, coronal images 70 and 71,
series 7).

IMA: Remains patent.

RIGHT Lower Extremity

Inflow: Noncalcified atherosclerotic plaque within the right common
iliac artery, not resulting in hemodynamically significant stenosis.
The right internal iliac artery is disease though patent and of
normal caliber. The right external iliac artery is widely patent
without hemodynamically significant stenosis.

Outflow: Mixed calcified and noncalcified atherosclerotic plaque
within the right common femoral artery, not resulting in
hemodynamically significant stenosis. The right deep femoral artery
is widely patent throughout its imaged course. Minimal amount of
calcified atherosclerotic plaque involving the proximal aspect of
the right superficial femoral artery (image 112, series 6), not
resulting in hemodynamically significant stenosis.

Atherosclerotic plaque within the right above and below-knee
popliteal artery, not resulting in hemodynamically significant
stenosis.

Runoff: Three-vessel runoff to the right lower leg and foot. The
right-sided dorsalis pedis artery is patent to the level of the
midfoot.

LEFT Lower Extremity

Inflow: Atherosclerotic plaque within the left common iliac artery,
not resulting in a hemodynamically significant stenosis. The left
internal iliac artery is diseased though patent and of normal
caliber. The left external iliac artery is widely patent.

Outflow: The left common femoral artery is obscured secondary streak
artifact from the patient's left total hip prosthesis.

The left deep femoral artery appears patent throughout its imaged
course.

There is age-indeterminate occlusion of the left superficial femoral
artery at the level of the proximal thigh (image 151, series 6) with
reconstitution at the level of the abductor canal via deep thigh
collaterals (image 193, series 6).

Atretic flow was seen throughout the remainder of the distal aspect
of the superficial femoral artery extending to both the left above
and below-knee popliteal arteries.

Runoff: Atretic flow was seen within the proximal aspects of all 3
tibial arteries, however all vessels are non-opacified at the level
of the proximal calf, potentially secondary to out running the
contrast bolus due to proximal occlusion.

Veins: Pelvic venous system and IVC appear widely patent on this
arterial phase examination. Note made of a tiny circumaortic left
renal vein.

Review of the MIP images confirms the above findings.

NON-VASCULAR

Evaluation the abdominal organs is limited to the arterial phase of
enhancement.

Lower chest: Limited visualization of the lower thorax demonstrates
approximately 1.4 x 0.7 cm nodular opacity with the peripheral
aspect of the imaged left lower lobe (image 3, series 6). Extensive
interstitial thickening within the imaged lung bases. No focal
airspace opacities. No pleural effusion.

Suspected cardiomegaly. There is reflux of contrast into the
intrahepatic venous system.

Hepatobiliary: Normal hepatic contour. There is reflux of injected
contrast into the hepatic venous system. No discrete hepatic
lesions. Normal appearance of the gallbladder given degree of
distention. No radiopaque gallstones. No intra extrahepatic biliary
ductal dilatation. No ascites.

Pancreas: Normal appearance of the pancreas

Spleen: Normal appearance of the spleen

Adrenals/Urinary Tract: There is symmetric enhancement of the
bilateral kidneys. Note is made of extra renal pelves bilaterally.
Note is made of a approximately 0.8 cm hypoattenuating partially
exophytic left-sided renal lesion, too small to accurately
characterize. No discrete right-sided renal lesions. No definite
renal stones this postcontrast examination. No urine obstruction or
perinephric stranding.

Normal appearance of the bilateral adrenal glands.

Normal appearance of the urinary bladder.

Stomach/Bowel: Large colonic stool burden without evidence of
enteric obstruction. Normal appearance of the terminal ileum. The
appendix is not visualized, however there is no pericecal
inflammatory change. No pneumoperitoneum, pneumatosis or portal
venous gas.

Lymphatic: Scattered retroperitoneal lymph nodes are numerous though
individually not enlarged by size criteria with index aortocaval
lymph node measuring 0.9 cm in greatest short axis diameter (image
46, series 6). No definitive bulky retroperitoneal, mesenteric,
pelvic or inguinal lymphadenopathy.

Reproductive: Note is made of a peripherally calcified
(approximately 1 cm) left-sided adnexal structure. Otherwise, normal
appearance of the pelvic organs for age. No free fluid in the pelvic
cul-de-sac.

Other: Diffuse body wall anasarca with subcutaneous edema seen about
the midline of the low back. Bilateral lower extremity subcutaneous
edema, left greater than right.

Musculoskeletal: Grade 1 anterolisthesis of L4 upon L5 measuring
approximately 1 cm with nondisplaced age-indeterminate left-sided L5
pars defect (sagittal image 121, series 8).

Age-indeterminate though presumably chronic moderate (approximately
40%) compression deformity involving the superior endplate of T12.

Moderate severe multilevel lumbar spine DDD, worse at L4-L5 and
L5-S1. Post left bipolar hip replacement without definite evidence
of hardware failure or loosening. Old right superior and inferior
pubic rami fractures.
IMPRESSION: VASCULAR

1. Atherosclerotic plaque within a normal caliber abdominal aorta.
2. Mixed calcified and noncalcified atherosclerotic plaque
approaches 50% luminal narrowing involving the origin of the left
renal artery without evidence of asymmetric left-sided renal
atrophy.
3.  Aortic Atherosclerosis ([id number].D).

Left lower extremity vascular Impression:

1. Age-indeterminate occlusion of the proximal aspect of the left
superficial femoral artery.
2. Atretic reconstitution of the distal aspect the left superficial
femoral artery with atretic flow through the left popliteal and
proximal aspects of the tibial arteries.
3. No arterial flow is seen below the level of the proximal calf
however this is potentially secondary to out running the contrast
bolus in the setting of the more proximal occlusion.

Right lower extremity vascular Impression:

1. No evidence of a hemodynamically significant stenosis affecting
the right lower extremity. Three-vessel runoff to the right lower
leg and foot. Right-sided dorsalis pedis artery is patent to the
level the midfoot.

_________________________________________________________

NON-VASCULAR

1. Findings suggestive of pulmonary edema with diffuse body wall
anasarca (most conspicuous about the midline of the low back) and
reflux of contrast into the intrahepatic venous system, nonspecific
though could be seen in the setting of right-sided heart failure.
Further evaluation with cardiac echo could be performed as
indicated.
2. 1.4 cm subpleural nodular opacity within the imaged left lower
lobe. Further evaluation with dedicated complete chest CT could be
performed after the resolution of the patient's acute symptoms.
3. Grade 1 anterolisthesis of L4 upon L5 with associated
age-indeterminate nondisplaced left-sided L5 pars defect.
4. Moderate to severe multilevel lumbar spine DDD, worse at L4-L5
and L5-S1.
